# Patient Record
Sex: Female | Born: 1980 | Hispanic: No | Marital: Married | Smoking: Never smoker
Health system: Southern US, Community
[De-identification: ages and names within clinical notes are randomized; demographics above are authoritative.]

## PROBLEM LIST (undated history)

## (undated) ENCOUNTER — Inpatient Hospital Stay (HOSPITAL_COMMUNITY): Payer: Self-pay

## (undated) DIAGNOSIS — O0990 Supervision of high risk pregnancy, unspecified, unspecified trimester: Secondary | ICD-10-CM

## (undated) HISTORY — PX: TONSILLECTOMY: SUR1361

---
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
## 2016-01-07 ENCOUNTER — Other Ambulatory Visit: Payer: Self-pay

## 2016-01-07 DIAGNOSIS — T8332XA Displacement of intrauterine contraceptive device, initial encounter: Secondary | ICD-10-CM

## 2016-01-07 NOTE — Progress Notes (Signed)
Per provider, pt needs US scheduled prior to NEW GYN appt.  US scheduled for November 21st @ 1045.  Front office to contact pt with both appts.

## 2016-01-25 ENCOUNTER — Ambulatory Visit (HOSPITAL_COMMUNITY): Admission: RE | Admit: 2016-01-25 | Payer: Self-pay | Source: Ambulatory Visit

## 2017-05-11 ENCOUNTER — Encounter: Payer: Self-pay | Admitting: Obstetrics & Gynecology

## 2017-05-13 ENCOUNTER — Emergency Department (HOSPITAL_COMMUNITY): Payer: Self-pay

## 2017-05-13 ENCOUNTER — Encounter (HOSPITAL_COMMUNITY): Payer: Self-pay

## 2017-05-13 ENCOUNTER — Other Ambulatory Visit: Payer: Self-pay

## 2017-05-13 ENCOUNTER — Emergency Department (HOSPITAL_COMMUNITY)
Admission: EM | Admit: 2017-05-13 | Discharge: 2017-05-13 | Disposition: A | Discharge status: Home / Self Care | Payer: Self-pay | Attending: Emergency Medicine | Admitting: Emergency Medicine

## 2017-05-13 DIAGNOSIS — R1084 Generalized abdominal pain: Secondary | ICD-10-CM | POA: Insufficient documentation

## 2017-05-13 DIAGNOSIS — R52 Pain, unspecified: Secondary | ICD-10-CM

## 2017-05-13 LAB — URINALYSIS, ROUTINE W REFLEX MICROSCOPIC
BILIRUBIN URINE: NEGATIVE
GLUCOSE, UA: NEGATIVE mg/dL
HGB URINE DIPSTICK: NEGATIVE
Ketones, ur: NEGATIVE mg/dL
NITRITE: NEGATIVE
PROTEIN: NEGATIVE mg/dL
SPECIFIC GRAVITY, URINE: 1.008 (ref 1.005–1.030)
pH: 8 (ref 5.0–8.0)

## 2017-05-13 LAB — COMPREHENSIVE METABOLIC PANEL
ALK PHOS: 72 U/L (ref 38–126)
ALT: 16 U/L (ref 14–54)
ANION GAP: 10 (ref 5–15)
AST: 20 U/L (ref 15–41)
Albumin: 4.2 g/dL (ref 3.5–5.0)
BILIRUBIN TOTAL: 0.3 mg/dL (ref 0.3–1.2)
BUN: 11 mg/dL (ref 6–20)
CALCIUM: 9.5 mg/dL (ref 8.9–10.3)
CO2: 22 mmol/L (ref 22–32)
Chloride: 105 mmol/L (ref 101–111)
Creatinine, Ser: 0.67 mg/dL (ref 0.44–1.00)
Glucose, Bld: 102 mg/dL — ABNORMAL HIGH (ref 65–99)
Potassium: 4.2 mmol/L (ref 3.5–5.1)
Sodium: 137 mmol/L (ref 135–145)
TOTAL PROTEIN: 7.9 g/dL (ref 6.5–8.1)

## 2017-05-13 LAB — CBC
HCT: 34.7 % — ABNORMAL LOW (ref 36.0–46.0)
HEMOGLOBIN: 10.5 g/dL — AB (ref 12.0–15.0)
MCH: 21 pg — ABNORMAL LOW (ref 26.0–34.0)
MCHC: 30.3 g/dL (ref 30.0–36.0)
MCV: 69.5 fL — ABNORMAL LOW (ref 78.0–100.0)
Platelets: 230 10*3/uL (ref 150–400)
RBC: 4.99 MIL/uL (ref 3.87–5.11)
RDW: 18.5 % — ABNORMAL HIGH (ref 11.5–15.5)
WBC: 9 10*3/uL (ref 4.0–10.5)

## 2017-05-13 LAB — I-STAT BETA HCG BLOOD, ED (MC, WL, AP ONLY)

## 2017-05-13 LAB — LIPASE, BLOOD: Lipase: 29 U/L (ref 11–51)

## 2017-05-13 MED ORDER — PANTOPRAZOLE SODIUM 20 MG PO TBEC: 20.0000 mg | DELAYED_RELEASE_TABLET | Freq: Every day | ORAL | 0 refills | Status: DC

## 2017-05-13 MED ORDER — ONDANSETRON 4 MG PO TBDP: 4.0000 mg | ORAL_TABLET | Freq: Three times a day (TID) | ORAL | 0 refills | Status: DC | PRN

## 2017-05-13 MED ORDER — MORPHINE SULFATE (PF) 4 MG/ML IV SOLN
4.0000 mg | Freq: Once | INTRAVENOUS | Status: AC
  Administered 2017-05-13: 4 mg via INTRAVENOUS
  Filled 2017-05-13: qty 1

## 2017-05-13 MED ORDER — ONDANSETRON HCL 4 MG/2ML IJ SOLN
4.0000 mg | Freq: Once | INTRAMUSCULAR | Status: AC
  Administered 2017-05-13: 4 mg via INTRAVENOUS
  Filled 2017-05-13: qty 2

## 2017-05-13 MED ORDER — DICYCLOMINE HCL 20 MG PO TABS: 20.0000 mg | ORAL_TABLET | Freq: Two times a day (BID) | ORAL | 0 refills | Status: DC

## 2017-05-13 MED ORDER — SODIUM CHLORIDE 0.9 % IV BOLUS (SEPSIS)
1000.0000 mL | Freq: Once | INTRAVENOUS | Status: AC
  Administered 2017-05-13: 1000 mL via INTRAVENOUS

## 2017-05-13 NOTE — ED Notes (Signed)
Used [employer] aerobic interpreter for translation

## 2017-05-13 NOTE — ED Provider Notes (Signed)
[facility]-EMERGENCY DEPT Provider Note   CSN: [id number] Arrival date & time: 05/13/17  1191     History   Chief Complaint Chief Complaint  Patient presents with  . Abdominal Pain    HPI Vickie Baldwin is a 37 y.o. female.  Pt presents to the ED today with abdominal pain.  Pt said pain started around 0200.  She points to pain in her epigastrium and in her RUQ.  The pt has a hx of gerd and IBS.  She is unsure if this is similar.  Pt does speak only arabic.  Information obtained via video interpreter.   Pt does not have a doctor here in [place], she is a recent arrival from [place].      History reviewed. No pertinent past medical history.  There are no active problems to display for this patient.   Past Surgical History:  Procedure Laterality Date  . CESAREAN SECTION    . TONSILLECTOMY      OB History    No data available       Home Medications    Prior to Admission medications   Medication Sig Start Date End Date Taking? Authorizing Provider  Collagen 500 MG CAPS Take 1 capsule by mouth daily.   Yes [provider]  PRESCRIPTION MEDICATION Prescription for IBS purchased in [place]   Yes [provider]  dicyclomine (BENTYL) 20 MG tablet Take 1 tablet (20 mg total) by mouth 2 (two) times daily. 05/13/17   Jacalyn Lefevre, MD  ondansetron (ZOFRAN ODT) 4 MG disintegrating tablet Take 1 tablet (4 mg total) by mouth every 8 (eight) hours as needed. 05/13/17   Jacalyn Lefevre, MD  pantoprazole (PROTONIX) 20 MG tablet Take 1 tablet (20 mg total) by mouth daily. 05/13/17   Jacalyn Lefevre, MD    Family History History reviewed. No pertinent family history.  Social History Social History   Tobacco Use  . Smoking status: Not on file  . Smokeless tobacco: Never Used  Substance Use Topics  . Alcohol use: No    Frequency: Never  . Drug use: No     Allergies   Patient has no known allergies.   Review of Systems Review of  Systems  Gastrointestinal: Positive for abdominal pain and nausea.  All other systems reviewed and are negative.    Physical Exam Updated Vital Signs BP (!) 123/57   Pulse 67   Temp 98 F (36.7 C) (Oral)   Resp 16   Ht 5\' 4"  (1.626 m)   Wt 88 kg (194 lb 0.1 oz)   SpO2 100%   BMI 33.30 kg/m   Physical Exam  Constitutional: She is oriented to person, place, and time. She appears well-developed and well-nourished.  HENT:  Head: Normocephalic and atraumatic.  Mouth/Throat: Mucous membranes are dry.  Eyes: EOM are normal. Pupils are equal, round, and reactive to light.  Cardiovascular: Normal rate, regular rhythm, normal heart sounds and intact distal pulses.  Pulmonary/Chest: Effort normal and breath sounds normal.  Abdominal: Normal appearance and bowel sounds are normal. There is tenderness in the right upper quadrant and epigastric area.  Neurological: She is alert and oriented to person, place, and time.  Skin: Skin is warm. Capillary refill takes less than 2 seconds.  Psychiatric: She has a normal mood and affect. Her behavior is normal.  Nursing note and vitals reviewed.    ED Treatments / Results  Labs (all labs ordered are listed, but only abnormal results are displayed)  Labs Reviewed  COMPREHENSIVE METABOLIC PANEL - Abnormal; Notable for the following components:      Result Value   Glucose, Bld 102 (*)    All other components within normal limits  CBC - Abnormal; Notable for the following components:   Hemoglobin 10.5 (*)    HCT 34.7 (*)    MCV 69.5 (*)    MCH 21.0 (*)    RDW 18.5 (*)    All other components within normal limits  URINALYSIS, ROUTINE W REFLEX MICROSCOPIC - Abnormal; Notable for the following components:   Leukocytes, UA TRACE (*)    Bacteria, UA FEW (*)    Squamous Epithelial / LPF 6-30 (*)    All other components within normal limits  LIPASE, BLOOD  I-STAT BETA HCG BLOOD, ED ([facility], [facility], [facility] ONLY)    EKG  EKG Interpretation None        Radiology [place] Abdomen Limited Ruq  Result Date: 05/13/2017 CLINICAL DATA:  Right upper quadrant pain and nausea since 2 a.m. EXAM: ULTRASOUND ABDOMEN LIMITED RIGHT UPPER QUADRANT COMPARISON:  None. FINDINGS: Gallbladder: No gallstones or wall thickening visualized. No sonographic Murphy sign noted by sonographer. Common bile duct: Diameter: 3 mm, normal. Liver: No focal lesion identified. Mildly increased in parenchymal echogenicity. Portal vein is patent on color Doppler imaging with normal direction of blood flow towards the liver. IMPRESSION: 1. No acute abnormality. 2. Mildly increased hepatic parenchymal echogenicity, as can be seen with steatosis. Electronically Signed   By: Obie DredgeWilliam T Derry M.D.   On: 05/13/2017 10:08    Procedures Procedures (including critical care time)  Medications Ordered in ED Medications  sodium chloride 0.9 % bolus 1,000 mL (0 mLs Intravenous Stopped 05/13/17 0959)  ondansetron (ZOFRAN) injection 4 mg (4 mg Intravenous Given 05/13/17 0820)  morphine 4 MG/ML injection 4 mg (4 mg Intravenous Given 05/13/17 0820)     Initial Impression / Assessment and Plan / ED Course  I have reviewed the triage vital signs and the nursing notes.  Pertinent labs & imaging results that were available during my care of the patient were reviewed by me and considered in my medical decision making (see chart for details).    Pt is feeling much better.  She is encouraged to make a log of the food she eats to see if something is irritating her stomach.  Family requests number to [place] as she lives near there.  She is given the number of GI and instructed to f/u and to return if worse.  Final Clinical Impressions(s) / ED Diagnoses   Final diagnoses:  Pain  Generalized abdominal pain    ED Discharge Orders        Ordered    pantoprazole (PROTONIX) 20 MG tablet  Daily     05/13/17 1034    ondansetron (ZOFRAN ODT) 4 MG disintegrating tablet  Every 8 hours PRN     05/13/17  1034    dicyclomine (BENTYL) 20 MG tablet  2 times daily     05/13/17 1034       Jacalyn LefevreHaviland, Jeb Schloemer, MD 05/13/17 1037

## 2017-05-13 NOTE — ED Triage Notes (Signed)
Since yesterday abdominal pain with nausea no dysuria voiced states not pregnant no fever.

## 2017-05-13 NOTE — ED Notes (Signed)
Bed: [id number] Expected date:  Expected time:  Means of arrival:  Comments: Triage 22

## 2017-05-13 NOTE — ED Notes (Signed)
Upon speaking with patient, patient has been diagnosed with IBS and GERD. Began having sharp pains at 0200 this morning in her umbilicus. Patient also states she has stomach and esophageal burning. Patient unsure if which one is causing her symptoms.

## 2017-06-06 ENCOUNTER — Encounter: Payer: Self-pay | Admitting: *Deleted

## 2017-06-15 ENCOUNTER — Ambulatory Visit: Payer: Self-pay | Admitting: Obstetrics & Gynecology

## 2017-07-23 ENCOUNTER — Encounter: Payer: Self-pay | Admitting: Obstetrics & Gynecology

## 2017-07-23 ENCOUNTER — Ambulatory Visit (INDEPENDENT_AMBULATORY_CARE_PROVIDER_SITE_OTHER): Payer: BLUE CROSS/BLUE SHIELD | Admitting: Obstetrics & Gynecology

## 2017-07-23 VITALS — BP 109/69 | HR 77 | Resp 16 | Ht 62.99 in | Wt 194.0 lb

## 2017-07-23 DIAGNOSIS — T8332XA Displacement of intrauterine contraceptive device, initial encounter: Secondary | ICD-10-CM

## 2017-07-23 DIAGNOSIS — Z30432 Encounter for removal of intrauterine contraceptive device: Secondary | ICD-10-CM | POA: Diagnosis not present

## 2017-07-23 NOTE — Progress Notes (Signed)
   Subjective:    Patient ID: Vickie Baldwin, female    DOB: 15-Apr-1980, 37 y.o.   MRN: [id number]  HPI 37 yo married P2 here to have her 37 yo Paragard removed. They have attempted to remove it 3 times at the health dept without success. She would like another pregnancy, hoping for a daughter.  Review of Systems She reports a normal pap smear at the health dept recently.    Objective:   Physical Exam Breathing, conversing, and ambulating normally Live interpretor used for this visit. Her strings were not visible. Her cervix was prepped with betadine and grasped with a single tooth tenaculum. I used a IUD hook to pull the intact Paragard out without difficulty. She tolerated the procedure well.     Assessment & Plan:  Desire for pregnancy Rec start MVIs daily to help prevent ONTDs

## 2017-08-08 ENCOUNTER — Encounter: Payer: Self-pay | Admitting: *Deleted

## 2018-02-05 ENCOUNTER — Ambulatory Visit: Payer: BLUE CROSS/BLUE SHIELD | Admitting: Family Medicine

## 2018-02-05 ENCOUNTER — Encounter: Payer: Self-pay | Admitting: Family Medicine

## 2018-02-05 ENCOUNTER — Other Ambulatory Visit: Payer: Self-pay

## 2018-02-05 VITALS — BP 111/73 | HR 102 | Temp 99.0°F | Resp 18 | Ht 62.99 in | Wt 197.2 lb

## 2018-02-05 DIAGNOSIS — R6889 Other general symptoms and signs: Secondary | ICD-10-CM

## 2018-02-05 DIAGNOSIS — Z23 Encounter for immunization: Secondary | ICD-10-CM

## 2018-02-05 DIAGNOSIS — Z3200 Encounter for pregnancy test, result unknown: Secondary | ICD-10-CM

## 2018-02-05 DIAGNOSIS — Z3491 Encounter for supervision of normal pregnancy, unspecified, first trimester: Secondary | ICD-10-CM

## 2018-02-05 LAB — POCT URINE PREGNANCY: PREG TEST UR: POSITIVE — AB

## 2018-02-05 NOTE — Progress Notes (Signed)
Patient ID: Vickie Baldwin, female    DOB: [birth year]  Age: 37 y.o. MRN: [id number]  Chief Complaint  Patient presents with  . Amenorrhea    missed period and took a home test and was positive     Subjective:   Has had a bad cough for the past couple days and some fever.  Her menstrual cycle is late, with LMP October 22.  She took a home pregnancy test and was positive.  Her children are in their 3 teens or teens, 2 children.  She is excited about being pregnant and says her husband is excited about it.  She is from [place], and has had a previous medical care in [place].  She has an appointment with an OB/GYN but it was not for a while longer so she came in here today.  Has not had a flu shot this year.  She has started taking some prenatal vitamins.  Also some prenatal supplements.  I told him the vitamins were the important thing.  Current allergies, medications, problem list, past/family and social histories reviewed.  Objective:  BP 111/73   Pulse (!) 102   Temp 99 F (37.2 C) (Oral)   Resp 18   Ht 5' 2.99" (1.6 m)   Wt 197 lb 3.2 oz (89.4 kg)   LMP 12/25/2017   SpO2 99%   BMI 34.94 kg/m   Does not look bad but she keeps coughing a dry cough.  Her TMs are normal.  Throat clear.  Neck supple without nodes.  Chest clear.  Heart regular without murmurs.  Assessment & Plan:   Assessment: 1. First trimester pregnancy   2. Possible pregnancy   3. Need for influenza vaccination   4. Flu-like symptoms       Plan: Explained to her that we cannot manage pregnancy problems.  However did tell her the prenatal vitamin was the right thing to be on.  See instructions.  Orders Placed This Encounter  Procedures  . Flu Vaccine QUAD 36+ mos IM  . POCT urine pregnancy    No orders of the defined types were placed in this encounter.        Patient Instructions    CONGRATULATIONS ON YOUR PREGNANCY!  Your due date should be late July, about July 29th or so.    Take prenatal  vitamins daily  Get your flu shot in a few days when feeling a little better.  You can come here or go to a pharmacy to get that.  If complications of pregnancy call the ob-gyn you have your appointment with and tell them you need to be seen sooner.  Take a DM containing syrup, such as Mucinex DM or Robitussin DM.    Tylenol 500 mg maximum of 2 pills 3 times daily  Return if worse.        If you have lab work done today you will be contacted with your lab results within the next 2 weeks.  If you have not heard from [place] then please contact us. The fastest way to get your results is to register for My Chart.   IF you received an x-ray today, you will receive an invoice from [facility]. Please contact [facility] at [phone] with questions or concerns regarding your invoice.   IF you received labwork today, you will receive an invoice from [facility]. Please contact [facility] at [phone] with questions or concerns regarding your invoice.   Our billing staff will not be able to assist  you with questions regarding bills from these companies.  You will be contacted with the lab results as soon as they are available. The fastest way to get your results is to activate your My Chart account. Instructions are located on the last page of this paperwork. If you have not heard from us regarding the results in 2 weeks, please contact this office.        Return if symptoms worsen or fail to improve.   Janace Hoardavid Hopper, MD 02/05/2018

## 2018-02-05 NOTE — Patient Instructions (Addendum)
  CONGRATULATIONS ON YOUR PREGNANCY!  Your due date should be late July, about July 29th or so.    Take prenatal vitamins daily  Get your flu shot in a few days when feeling a little better.  You can come here or go to a pharmacy to get that.  If complications of pregnancy call the ob-gyn you have your appointment with and tell them you need to be seen sooner.  Take a DM containing syrup, such as Mucinex DM or Robitussin DM.    Tylenol 500 mg maximum of 2 pills 3 times daily  Return if worse.        If you have lab work done today you will be contacted with your lab results within the next 2 weeks.  If you have not heard from us then please contact us. The fastest way to get your results is to register for My Chart.   IF you received an x-ray today, you will receive an invoice from [facility]. Please contact [facility] at [phone] with questions or concerns regarding your invoice.   IF you received labwork today, you will receive an invoice from [facility]. Please contact [facility] at [phone] with questions or concerns regarding your invoice.   Our billing staff will not be able to assist you with questions regarding bills from these companies.  You will be contacted with the lab results as soon as they are available. The fastest way to get your results is to activate your My Chart account. Instructions are located on the last page of this paperwork. If you have not heard from us regarding the results in 2 weeks, please contact this office.

## 2018-02-08 ENCOUNTER — Other Ambulatory Visit: Payer: Self-pay

## 2018-02-08 ENCOUNTER — Ambulatory Visit (INDEPENDENT_AMBULATORY_CARE_PROVIDER_SITE_OTHER): Payer: BLUE CROSS/BLUE SHIELD

## 2018-02-08 VITALS — BP 122/67 | HR 96 | Ht 62.0 in | Wt 197.0 lb

## 2018-02-08 DIAGNOSIS — Z3201 Encounter for pregnancy test, result positive: Secondary | ICD-10-CM | POA: Diagnosis not present

## 2018-02-08 DIAGNOSIS — O0990 Supervision of high risk pregnancy, unspecified, unspecified trimester: Secondary | ICD-10-CM

## 2018-02-08 DIAGNOSIS — Z3480 Encounter for supervision of other normal pregnancy, unspecified trimester: Secondary | ICD-10-CM

## 2018-02-08 HISTORY — DX: Supervision of high risk pregnancy, unspecified, unspecified trimester: O09.90

## 2018-02-08 LAB — POCT URINE PREGNANCY: PREG TEST UR: POSITIVE — AB

## 2018-02-08 NOTE — Progress Notes (Signed)
Vickie Baldwin presents today for UPT. She has no unusual complaints. LMP: 12/25/17 EDD 09/30/17    OBJECTIVE: Appears well, in no apparent distress.  OB History    Gravida  3   Para  2   Term  2   Preterm      AB      Living  2     SAB      TAB      Ectopic      Multiple      Live Births             Home UPT Result: POSITIVE In-Office UPT result: POSITIVE I have reviewed the patient's medical, obstetrical, social, and family histories, and medications.   ASSESSMENT: Positive pregnancy test  PLAN Prenatal care to be completed at: [facility]

## 2018-03-11 ENCOUNTER — Other Ambulatory Visit (HOSPITAL_COMMUNITY)
Admission: RE | Admit: 2018-03-11 | Discharge: 2018-03-11 | Disposition: A | Discharge status: Home / Self Care | Payer: BLUE CROSS/BLUE SHIELD | Source: Ambulatory Visit | Attending: Obstetrics and Gynecology | Admitting: Obstetrics and Gynecology

## 2018-03-11 ENCOUNTER — Ambulatory Visit (INDEPENDENT_AMBULATORY_CARE_PROVIDER_SITE_OTHER): Payer: BLUE CROSS/BLUE SHIELD | Admitting: Obstetrics and Gynecology

## 2018-03-11 ENCOUNTER — Encounter: Payer: Self-pay | Admitting: Obstetrics and Gynecology

## 2018-03-11 DIAGNOSIS — Z23 Encounter for immunization: Secondary | ICD-10-CM | POA: Diagnosis not present

## 2018-03-11 DIAGNOSIS — Z3480 Encounter for supervision of other normal pregnancy, unspecified trimester: Secondary | ICD-10-CM | POA: Insufficient documentation

## 2018-03-11 DIAGNOSIS — Z3481 Encounter for supervision of other normal pregnancy, first trimester: Secondary | ICD-10-CM

## 2018-03-11 DIAGNOSIS — Z98891 History of uterine scar from previous surgery: Secondary | ICD-10-CM | POA: Insufficient documentation

## 2018-03-11 DIAGNOSIS — O34219 Maternal care for unspecified type scar from previous cesarean delivery: Secondary | ICD-10-CM

## 2018-03-11 DIAGNOSIS — O09529 Supervision of elderly multigravida, unspecified trimester: Secondary | ICD-10-CM | POA: Insufficient documentation

## 2018-03-11 DIAGNOSIS — O09521 Supervision of elderly multigravida, first trimester: Secondary | ICD-10-CM

## 2018-03-11 DIAGNOSIS — O99211 Obesity complicating pregnancy, first trimester: Secondary | ICD-10-CM

## 2018-03-11 DIAGNOSIS — O99210 Obesity complicating pregnancy, unspecified trimester: Secondary | ICD-10-CM | POA: Insufficient documentation

## 2018-03-11 MED ORDER — PROMETHAZINE HCL 25 MG PO TABS: 25.0000 mg | ORAL_TABLET | Freq: Four times a day (QID) | ORAL | 1 refills | Status: DC | PRN

## 2018-03-11 MED ORDER — VITAFOL ULTRA 29-0.6-0.4-200 MG PO CAPS: 1.0000 | ORAL_CAPSULE | Freq: Every day | ORAL | 12 refills | Status: DC

## 2018-03-11 NOTE — Progress Notes (Signed)
Patient is in the office for NOB, denies pain. 

## 2018-03-11 NOTE — Patient Instructions (Signed)
Contraception Choices Contraception, also called birth control, refers to methods or devices that prevent pregnancy. Hormonal methods Contraceptive implant  A contraceptive implant is a thin, plastic tube that contains a hormone. It is inserted into the upper part of the arm. It can remain in place for up to 3 years. Progestin-only injections Progestin-only injections are injections of progestin, a synthetic form of the hormone progesterone. They are given every 3 months by a health care provider. Birth control pills  Birth control pills are pills that contain hormones that prevent pregnancy. They must be taken once a day, preferably at the same time each day. Birth control patch  The birth control patch contains hormones that prevent pregnancy. It is placed on the skin and must be changed once a week for three weeks and removed on the fourth week. A prescription is needed to use this method of contraception. Vaginal ring  A vaginal ring contains hormones that prevent pregnancy. It is placed in the vagina for three weeks and removed on the fourth week. After that, the process is repeated with a new ring. A prescription is needed to use this method of contraception. Emergency contraceptive Emergency contraceptives prevent pregnancy after unprotected sex. They come in pill form and can be taken up to 5 days after sex. They work best the sooner they are taken after having sex. Most emergency contraceptives are available without a prescription. This method should not be used as your only form of birth control. Barrier methods Female condom  A female condom is a thin sheath that is worn over the penis during sex. Condoms keep sperm from going inside a woman's body. They can be used with a spermicide to increase their effectiveness. They should be disposed after a single use. Female condom  A female condom is a soft, loose-fitting sheath that is put into the vagina before sex. The condom keeps  sperm from going inside a woman's body. They should be disposed after a single use. Diaphragm  A diaphragm is a soft, dome-shaped barrier. It is inserted into the vagina before sex, along with a spermicide. The diaphragm blocks sperm from entering the uterus, and the spermicide kills sperm. A diaphragm should be left in the vagina for 6-8 hours after sex and removed within 24 hours. A diaphragm is prescribed and fitted by a health care provider. A diaphragm should be replaced every 1-2 years, after giving birth, after gaining more than 15 lb (6.8 kg), and after pelvic surgery. Cervical cap  A cervical cap is a round, soft latex or plastic cup that fits over the cervix. It is inserted into the vagina before sex, along with spermicide. It blocks sperm from entering the uterus. The cap should be left in place for 6-8 hours after sex and removed within 48 hours. A cervical cap must be prescribed and fitted by a health care provider. It should be replaced every 2 years. Sponge  A sponge is a soft, circular piece of polyurethane foam with spermicide on it. The sponge helps block sperm from entering the uterus, and the spermicide kills sperm. To use it, you make it wet and then insert it into the vagina. It should be inserted before sex, left in for at least 6 hours after sex, and removed and thrown away within 30 hours. Spermicides Spermicides are chemicals that kill or block sperm from entering the cervix and uterus. They can come as a cream, jelly, suppository, foam, or tablet. A spermicide should be inserted into  the vagina with an applicator at least 10-15 minutes before sex to allow time for it to work. The process must be repeated every time you have sex. Spermicides do not require a prescription. Intrauterine contraception Intrauterine device (IUD) An IUD is a T-shaped device that is put in a woman's uterus. There are two types:  Hormone IUD.This type contains progestin, a synthetic form of the  hormone progesterone. This type can stay in place for 3-5 years.  Copper IUD.This type is wrapped in copper wire. It can stay in place for 10 years.  Permanent methods of contraception Female tubal ligation In this method, a woman's fallopian tubes are sealed, tied, or blocked during surgery to prevent eggs from traveling to the uterus. Hysteroscopic sterilization In this method, a small, flexible insert is placed into each fallopian tube. The inserts cause scar tissue to form in the fallopian tubes and block them, so sperm cannot reach an egg. The procedure takes about 3 months to be effective. Another form of birth control must be used during those 3 months. Female sterilization This is a procedure to tie off the tubes that carry sperm (vasectomy). After the procedure, the man can still ejaculate fluid (semen). Natural planning methods Natural family planning In this method, a couple does not have sex on days when the woman could become pregnant. Calendar method This means keeping track of the length of each menstrual cycle, identifying the days when pregnancy can happen, and not having sex on those days. Ovulation method In this method, a couple avoids sex during ovulation. Symptothermal method This method involves not having sex during ovulation. The woman typically checks for ovulation by watching changes in her temperature and in the consistency of cervical mucus. Post-ovulation method In this method, a couple waits to have sex until after ovulation. Summary  Contraception, also called birth control, means methods or devices that prevent pregnancy.  Hormonal methods of contraception include implants, injections, pills, patches, vaginal rings, and emergency contraceptives.  Barrier methods of contraception can include female condoms, female condoms, diaphragms, cervical caps, sponges, and spermicides.  There are two types of IUDs (intrauterine devices). An IUD can be put in a woman's  uterus to prevent pregnancy for 3-5 years.  Permanent sterilization can be done through a procedure for males, females, or both.  Natural family planning methods involve not having sex on days when the woman could become pregnant. This information is not intended to replace advice given to you by your health care provider. Make sure you discuss any questions you have with your health care provider. Document Released: 02/20/2005 Document Revised: 02/22/2017 Document Reviewed: 03/25/2016 Elsevier Interactive Patient Education  2019 [employer].   Breastfeeding  Choosing to breastfeed is one of the best decisions you can make for yourself and your baby. A change in hormones during pregnancy causes your breasts to make breast milk in your milk-producing glands. Hormones prevent breast milk from being released before your baby is born. They also prompt milk flow after birth. Once breastfeeding has begun, thoughts of your baby, as well as his or her sucking or crying, can stimulate the release of milk from your milk-producing glands. Benefits of breastfeeding Research shows that breastfeeding offers many health benefits for infants and mothers. It also offers a cost-free and convenient way to feed your baby. For your baby  Your first milk (colostrum) helps your baby's digestive system to function better.  Special cells in your milk (antibodies) help your baby to fight off infections.  Breastfed babies are less likely to develop asthma, allergies, obesity, or type 2 diabetes. They are also at lower risk for sudden infant death syndrome (SIDS).  Nutrients in breast milk are better able to meet your baby's needs compared to infant formula.  Breast milk improves your baby's brain development. For you  Breastfeeding helps to create a very special bond between you and your baby.  Breastfeeding is convenient. Breast milk costs nothing and is always available at the correct  temperature.  Breastfeeding helps to burn calories. It helps you to lose the weight that you gained during pregnancy.  Breastfeeding makes your uterus return faster to its size before pregnancy. It also slows bleeding (lochia) after you give birth.  Breastfeeding helps to lower your risk of developing type 2 diabetes, osteoporosis, rheumatoid arthritis, cardiovascular disease, and breast, ovarian, uterine, and endometrial cancer later in life. Breastfeeding basics Starting breastfeeding  Find a comfortable place to sit or lie down, with your neck and back well-supported.  Place a pillow or a rolled-up blanket under your baby to bring him or her to the level of your breast (if you are seated). Nursing pillows are specially designed to help support your arms and your baby while you breastfeed.  Make sure that your baby's tummy (abdomen) is facing your abdomen.  Gently massage your breast. With your fingertips, massage from the outer edges of your breast inward toward the nipple. This encourages milk flow. If your milk flows slowly, you may need to continue this action during the feeding.  Support your breast with 4 fingers underneath and your thumb above your nipple (make the letter "C" with your hand). Make sure your fingers are well away from your nipple and your baby's mouth.  Stroke your baby's lips gently with your finger or nipple.  When your baby's mouth is open wide enough, quickly bring your baby to your breast, placing your entire nipple and as much of the areola as possible into your baby's mouth. The areola is the colored area around your nipple. ? More areola should be visible above your baby's upper lip than below the lower lip. ? Your baby's lips should be opened and extended outward (flanged) to ensure an adequate, comfortable latch. ? Your baby's tongue should be between his or her lower gum and your breast.  Make sure that your baby's mouth is correctly positioned around  your nipple (latched). Your baby's lips should create a seal on your breast and be turned out (everted).  It is common for your baby to suck about 2-3 minutes in order to start the flow of breast milk. Latching Teaching your baby how to latch onto your breast properly is very important. An improper latch can cause nipple pain, decreased milk supply, and poor weight gain in your baby. Also, if your baby is not latched onto your nipple properly, he or she may swallow some air during feeding. This can make your baby fussy. Burping your baby when you switch breasts during the feeding can help to get rid of the air. However, teaching your baby to latch on properly is still the best way to prevent fussiness from swallowing air while breastfeeding. Signs that your baby has successfully latched onto your nipple  Silent tugging or silent sucking, without causing you pain. Infant's lips should be extended outward (flanged).  Swallowing heard between every 3-4 sucks once your milk has started to flow (after your let-down milk reflex occurs).  Muscle movement above and in front of  his or her ears while sucking. Signs that your baby has not successfully latched onto your nipple  Sucking sounds or smacking sounds from your baby while breastfeeding.  Nipple pain. If you think your baby has not latched on correctly, slip your finger into the corner of your baby's mouth to break the suction and place it between your baby's gums. Attempt to start breastfeeding again. Signs of successful breastfeeding Signs from your baby  Your baby will gradually decrease the number of sucks or will completely stop sucking.  Your baby will fall asleep.  Your baby's body will relax.  Your baby will retain a small amount of milk in his or her mouth.  Your baby will let go of your breast by himself or herself. Signs from you  Breasts that have increased in firmness, weight, and size 1-3 hours after feeding.  Breasts  that are softer immediately after breastfeeding.  Increased milk volume, as well as a change in milk consistency and color by the fifth day of breastfeeding.  Nipples that are not sore, cracked, or bleeding. Signs that your baby is getting enough milk  Wetting at least 1-2 diapers during the first 24 hours after birth.  Wetting at least 5-6 diapers every 24 hours for the first week after birth. The urine should be clear or pale yellow by the age of 5 days.  Wetting 6-8 diapers every 24 hours as your baby continues to grow and develop.  At least 3 stools in a 24-hour period by the age of 5 days. The stool should be soft and yellow.  At least 3 stools in a 24-hour period by the age of 7 days. The stool should be seedy and yellow.  No loss of weight greater than 10% of birth weight during the first 3 days of life.  Average weight gain of 4-7 oz (113-198 g) per week after the age of 4 days.  Consistent daily weight gain by the age of 5 days, without weight loss after the age of 2 weeks. After a feeding, your baby may spit up a small amount of milk. This is normal. Breastfeeding frequency and duration Frequent feeding will help you make more milk and can prevent sore nipples and extremely full breasts (breast engorgement). Breastfeed when you feel the need to reduce the fullness of your breasts or when your baby shows signs of hunger. This is called "breastfeeding on demand." Signs that your baby is hungry include:  Increased alertness, activity, or restlessness.  Movement of the head from side to side.  Opening of the mouth when the corner of the mouth or cheek is stroked (rooting).  Increased sucking sounds, smacking lips, cooing, sighing, or squeaking.  Hand-to-mouth movements and sucking on fingers or hands.  Fussing or crying. Avoid introducing a pacifier to your baby in the first 4-6 weeks after your baby is born. After this time, you may choose to use a pacifier. Research has  shown that pacifier use during the first year of a baby's life decreases the risk of sudden infant death syndrome (SIDS). Allow your baby to feed on each breast as long as he or she wants. When your baby unlatches or falls asleep while feeding from the first breast, offer the second breast. Because newborns are often sleepy in the first few weeks of life, you may need to awaken your baby to get him or her to feed. Breastfeeding times will vary from baby to baby. However, the following rules can serve as  a guide to help you make sure that your baby is properly fed:  Newborns (babies 85 weeks of age or younger) may breastfeed every 1-3 hours.  Newborns should not go without breastfeeding for longer than 3 hours during the day or 5 hours during the night.  You should breastfeed your baby a minimum of 8 times in a 24-hour period. Breast milk pumping     Pumping and storing breast milk allows you to make sure that your baby is exclusively fed your breast milk, even at times when you are unable to breastfeed. This is especially important if you go back to work while you are still breastfeeding, or if you are not able to be present during feedings. Your lactation consultant can help you find a method of pumping that works best for you and give you guidelines about how long it is safe to store breast milk. Caring for your breasts while you breastfeed Nipples can become dry, cracked, and sore while breastfeeding. The following recommendations can help keep your breasts moisturized and healthy:  Avoid using soap on your nipples.  Wear a supportive bra designed especially for nursing. Avoid wearing underwire-style bras or extremely tight bras (sports bras).  Air-dry your nipples for 3-4 minutes after each feeding.  Use only cotton bra pads to absorb leaked breast milk. Leaking of breast milk between feedings is normal.  Use lanolin on your nipples after breastfeeding. Lanolin helps to maintain your  skin's normal moisture barrier. Pure lanolin is not harmful (not toxic) to your baby. You may also hand express a few drops of breast milk and gently massage that milk into your nipples and allow the milk to air-dry. In the first few weeks after giving birth, some women experience breast engorgement. Engorgement can make your breasts feel heavy, warm, and tender to the touch. Engorgement peaks within 3-5 days after you give birth. The following recommendations can help to ease engorgement:  Completely empty your breasts while breastfeeding or pumping. You may want to start by applying warm, moist heat (in the shower or with warm, water-soaked hand towels) just before feeding or pumping. This increases circulation and helps the milk flow. If your baby does not completely empty your breasts while breastfeeding, pump any extra milk after he or she is finished.  Apply ice packs to your breasts immediately after breastfeeding or pumping, unless this is too uncomfortable for you. To do this: ? Put ice in a plastic bag. ? Place a towel between your skin and the bag. ? Leave the ice on for 20 minutes, 2-3 times a day.  Make sure that your baby is latched on and positioned properly while breastfeeding. If engorgement persists after 48 hours of following these recommendations, contact your health care provider or a Advertising copywriter. Overall health care recommendations while breastfeeding  Eat 3 healthy meals and 3 snacks every day. Well-nourished mothers who are breastfeeding need an additional 450-500 calories a day. You can meet this requirement by increasing the amount of a balanced diet that you eat.  Drink enough water to keep your urine pale yellow or clear.  Rest often, relax, and continue to take your prenatal vitamins to prevent fatigue, stress, and low vitamin and mineral levels in your body (nutrient deficiencies).  Do not use any products that contain nicotine or tobacco, such as cigarettes  and e-cigarettes. Your baby may be harmed by chemicals from cigarettes that pass into breast milk and exposure to secondhand smoke. If you need help  quitting, ask your health care provider.  Avoid alcohol.  Do not use illegal drugs or marijuana.  Talk with your health care provider before taking any medicines. These include over-the-counter and prescription medicines as well as vitamins and herbal supplements. Some medicines that may be harmful to your baby can pass through breast milk.  It is possible to become pregnant while breastfeeding. If birth control is desired, ask your health care provider about options that will be safe while breastfeeding your baby. Where to find more information: [employer]: www.llli.org Contact a health care provider if:  You feel like you want to stop breastfeeding or have become frustrated with breastfeeding.  Your nipples are cracked or bleeding.  Your breasts are red, tender, or warm.  You have: ? Painful breasts or nipples. ? A swollen area on either breast. ? A fever or chills. ? Nausea or vomiting. ? Drainage other than breast milk from your nipples.  Your breasts do not become full before feedings by the fifth day after you give birth.  You feel sad and depressed.  Your baby is: ? Too sleepy to eat well. ? Having trouble sleeping. ? More than 931 week old and wetting fewer than 6 diapers in a 24-hour period. ? Not gaining weight by 365 days of age.  Your baby has fewer than 3 stools in a 24-hour period.  Your baby's skin or the white parts of his or her eyes become yellow. Get help right away if:  Your baby is overly tired (lethargic) and does not want to wake up and feed.  Your baby develops an unexplained fever. Summary  Breastfeeding offers many health benefits for infant and mothers.  Try to breastfeed your infant when he or she shows early signs of hunger.  Gently tickle or stroke your baby's lips with your  finger or nipple to allow the baby to open his or her mouth. Bring the baby to your breast. Make sure that much of the areola is in your baby's mouth. Offer one side and burp the baby before you offer the other side.  Talk with your health care provider or lactation consultant if you have questions or you face problems as you breastfeed. This information is not intended to replace advice given to you by your health care provider. Make sure you discuss any questions you have with your health care provider. Document Released: 02/20/2005 Document Revised: 03/24/2016 Document Reviewed: 03/24/2016 Elsevier Interactive Patient Education  2019 [employer]   Vaginal Birth After Cesarean Delivery  Vaginal birth after cesarean delivery (VBAC) is giving birth vaginally after previously delivering a baby through a cesarean section (C-section). A VBAC may be a safe option for you, depending on your health and other factors. It is important to discuss VBAC with your health care provider early in your pregnancy so you can understand the risks, benefits, and options. Having these discussions early will give you time to make your birth plan. Who are the best candidates for VBAC? The best candidates for VBAC are women who:  Have had one or two prior cesarean deliveries, and the incision made during the delivery was horizontal (low transverse).  Do not have a vertical (classical) scar on their uterus.  Have not had a tear in the wall of their uterus (uterine rupture).  Plan to have more pregnancies. A VBAC is also more likely to be successful:  In women who have previously given birth vaginally.  When labor starts by itself (spontaneously)  before the due date. What are the benefits of VBAC? The benefits of delivering your baby vaginally instead of by a cesarean delivery include:  A shorter hospital stay.  A faster recovery time.  Less pain.  Avoiding risks associated with major surgery, such as  infection and blood clots.  Less blood loss and less need for donated blood (transfusions). What are the risks of VBAC? The main risk of attempting a VBAC is that it may fail, forcing your health care provider to deliver your baby by a C-section. Other risks are rare and include:  Tearing (rupture) of the scar from a past cesarean delivery.  Other risks associated with vaginal deliveries. If a repeat cesarean delivery is needed, the risks include:  Blood loss.  Infection.  Blood clot.  Damage to surrounding organs.  Removal of the uterus (hysterectomy), if it is damaged.  Placenta problems in future pregnancies. What else should I know about my options? Delivering a baby through a VBAC is similar to having a normal spontaneous vaginal delivery. Therefore, it is safe:  To try with twins.  For your health care provider to try to turn the baby from a breech position (external cephalic version) during labor.  With epidural analgesia for pain relief. Consider where you would like to deliver your baby. VBAC should be attempted in facilities where an emergency cesarean delivery can be performed. VBAC is not recommended for home births. Any changes in your health or your baby's health during your pregnancy may make it necessary to change your initial decision about VBAC. Your health care provider may recommend that you do not attempt a VBAC if:  Your baby's suspected weight is 8.8 lb (4 kg) or more.  You have preeclampsia. This is a condition that causes high blood pressure along with other symptoms, such as swelling and headaches.  You will have VBAC less than 19 months after your cesarean delivery.  You are past your due date.  You need to have labor started (induced) because your cervix is not ready for labor (unfavorable). Where to find more information  American Pregnancy Association: americanpregnancy.org  [employer]:  acog.org Summary  Vaginal birth after cesarean delivery (VBAC) is giving birth vaginally after previously delivering a baby through a cesarean section (C-section). A VBAC may be a safe option for you, depending on your health and other factors.  Discuss VBAC with your health care provider early in your pregnancy so you can understand the risks, benefits, options, and have plenty of time to make your birth plan.  The main risk of attempting a VBAC is that it may fail, forcing your health care provider to deliver your baby by a C-section. Other risks are rare. This information is not intended to replace advice given to you by your health care provider. Make sure you discuss any questions you have with your health care provider. Document Released: 08/13/2006 Document Revised: 05/30/2016 Document Reviewed: 05/30/2016 Elsevier Interactive Patient Education  2019 [employer].

## 2018-03-11 NOTE — Progress Notes (Signed)
  Subjective:    Vickie Baldwin is a [id number] 3776w6d being seen today for her first obstetrical visit.  Her obstetrical history is significant for advanced maternal age and previous cesarean section due to failure to progress and maternal obesity. Patient does intend to breast feed. Pregnancy history fully reviewed.  Patient reports nausea.  Vitals:   03/11/18 1052  BP: 108/70  Pulse: 98  Weight: 196 lb (88.9 kg)    HISTORY: OB History  Gravida Para Term Preterm AB Living  3 2 2     2   SAB TAB Ectopic Multiple Live Births          2    # Outcome Date GA Lbr Len/2nd Weight Sex Delivery Anes PTL Lv  3 Current           2 Term 09/30/06     CS-LTranv   LIV  1 Term 10/18/02     Vag-Spont   LIV   History reviewed. No pertinent past medical history. Past Surgical History:  Procedure Laterality Date  . CESAREAN SECTION    . TONSILLECTOMY     Family History  Problem Relation Age of Onset  . Diabetes Mother   . Hypertension Father   . Diabetes Brother      Exam    Uterus:     Pelvic Exam:    Perineum: No Hemorrhoids, Normal Perineum   Vulva: normal   Vagina:  normal mucosa, normal discharge   pH:    Cervix: multiparous appearance and cervix is closed and long   Adnexa: normal adnexa and no mass, fullness, tenderness   Bony Pelvis: gynecoid  System: Breast:  normal appearance, no masses or tenderness   Skin: normal coloration and turgor, no rashes    Neurologic: oriented, no focal deficits   Extremities: normal strength, tone, and muscle mass   HEENT extra ocular movement intact   Mouth/Teeth mucous membranes moist, pharynx normal without lesions and dental hygiene good   Neck supple and no masses   Cardiovascular: regular rate and rhythm   Respiratory:  appears well, vitals normal, no respiratory distress, acyanotic, normal RR, chest clear, no wheezing, crepitations, rhonchi, normal symmetric air entry   Abdomen: soft, non-tender; bowel sounds normal; no masses,  no  organomegaly   Urinary:       Assessment:    Pregnancy: [id number] Patient Active Problem List   Diagnosis Date Noted  . Previous cesarean delivery affecting pregnancy, antepartum 03/11/2018  . AMA (advanced maternal age) multigravida 35+ 03/11/2018  . Supervision of other normal pregnancy, antepartum 02/08/2018        Plan:     Initial labs drawn. Prenatal vitamins. Problem list reviewed and updated. Rx phenergan provided Genetic Screening discussed: panorama ordered.  Ultrasound discussed; fetal survey: ordered.  Follow up in 4 weeks. 50% of 30 min visit spent on counseling and coordination of care.     Javante Nilsson 03/11/2018

## 2018-03-13 LAB — OBSTETRIC PANEL, INCLUDING HIV
ANTIBODY SCREEN: NEGATIVE
BASOS ABS: 0 10*3/uL (ref 0.0–0.2)
Basos: 0 %
EOS (ABSOLUTE): 0.1 10*3/uL (ref 0.0–0.4)
Eos: 2 %
HEMATOCRIT: 36 % (ref 34.0–46.6)
HEP B S AG: NEGATIVE
HIV Screen 4th Generation wRfx: NONREACTIVE
Hemoglobin: 10.9 g/dL — ABNORMAL LOW (ref 11.1–15.9)
IMMATURE GRANS (ABS): 0 10*3/uL (ref 0.0–0.1)
Immature Granulocytes: 0 %
LYMPHS: 28 %
Lymphocytes Absolute: 1.5 10*3/uL (ref 0.7–3.1)
MCH: 22.8 pg — ABNORMAL LOW (ref 26.6–33.0)
MCHC: 30.3 g/dL — ABNORMAL LOW (ref 31.5–35.7)
MCV: 75 fL — AB (ref 79–97)
MONOCYTES: 7 %
Monocytes Absolute: 0.4 10*3/uL (ref 0.1–0.9)
NEUTROS ABS: 3.3 10*3/uL (ref 1.4–7.0)
Neutrophils: 63 %
PLATELETS: 215 10*3/uL (ref 150–450)
RBC: 4.79 x10E6/uL (ref 3.77–5.28)
RDW: 22.4 % — ABNORMAL HIGH (ref 11.7–15.4)
RPR: NONREACTIVE
RUBELLA: 13.2 {index} (ref >0.99)
Rh Factor: POSITIVE
WBC: 5.3 10*3/uL (ref 3.4–10.8)

## 2018-03-13 LAB — CULTURE, OB URINE

## 2018-03-13 LAB — URINE CULTURE, OB REFLEX: ORGANISM ID, BACTERIA: NO GROWTH

## 2018-03-14 LAB — CYTOLOGY - PAP
Chlamydia: NEGATIVE
Diagnosis: NEGATIVE
HPV (WINDOPATH): NOT DETECTED
Neisseria Gonorrhea: NEGATIVE

## 2018-03-16 ENCOUNTER — Inpatient Hospital Stay (HOSPITAL_COMMUNITY)
Admission: AD | Admit: 2018-03-16 | Discharge: 2018-03-16 | Disposition: A | Discharge status: Home / Self Care | Payer: BLUE CROSS/BLUE SHIELD | Source: Ambulatory Visit | Attending: Family Medicine | Admitting: Family Medicine

## 2018-03-16 ENCOUNTER — Encounter (HOSPITAL_COMMUNITY): Payer: Self-pay | Admitting: *Deleted

## 2018-03-16 DIAGNOSIS — O26891 Other specified pregnancy related conditions, first trimester: Secondary | ICD-10-CM | POA: Diagnosis not present

## 2018-03-16 DIAGNOSIS — O219 Vomiting of pregnancy, unspecified: Secondary | ICD-10-CM | POA: Diagnosis not present

## 2018-03-16 DIAGNOSIS — N898 Other specified noninflammatory disorders of vagina: Secondary | ICD-10-CM

## 2018-03-16 DIAGNOSIS — Z3A11 11 weeks gestation of pregnancy: Secondary | ICD-10-CM | POA: Diagnosis not present

## 2018-03-16 DIAGNOSIS — O210 Mild hyperemesis gravidarum: Secondary | ICD-10-CM | POA: Insufficient documentation

## 2018-03-16 LAB — URINALYSIS, ROUTINE W REFLEX MICROSCOPIC
Bilirubin Urine: NEGATIVE
GLUCOSE, UA: NEGATIVE mg/dL
HGB URINE DIPSTICK: NEGATIVE
KETONES UR: NEGATIVE mg/dL
LEUKOCYTES UA: NEGATIVE
Nitrite: NEGATIVE
PROTEIN: NEGATIVE mg/dL
Specific Gravity, Urine: 1.028 (ref 1.005–1.030)
pH: 5 (ref 5.0–8.0)

## 2018-03-16 LAB — WET PREP, GENITAL
CLUE CELLS WET PREP: NONE SEEN
Sperm: NONE SEEN
Trich, Wet Prep: NONE SEEN
Yeast Wet Prep HPF POC: NONE SEEN

## 2018-03-16 MED ORDER — ONDANSETRON 4 MG PO TBDP: 4.0000 mg | ORAL_TABLET | Freq: Three times a day (TID) | ORAL | 0 refills | Status: DC | PRN

## 2018-03-16 NOTE — MAU Provider Note (Signed)
Chief Complaint: Vaginal Discharge   First Provider Initiated Contact with Patient 03/16/18 1600     SUBJECTIVE HPI: Vickie Baldwin is a 38 y.o. G3P2002 at 7275w4d who presents to [facility] reporting nausea & vaginal discharge. States nausea has worsened this week but is not vomiting. Does not have nausea medication at home and would like prescription for something.  Reports gush of discharge this morning. Discharge was clear and without odor. Has not continued. Denies vaginal itching or irritation. No vaginal bleeding or dysuria. No recent intercourse. Denies abdominal pain.    History reviewed. No pertinent past medical history. OB History  Gravida Para Term Preterm AB Living  3 2 2     2   SAB TAB Ectopic Multiple Live Births          2    # Outcome Date GA Lbr Len/2nd Weight Sex Delivery Anes PTL Lv  3 Current           2 Term 09/30/06     CS-LTranv   LIV  1 Term 10/18/02     Vag-Spont   LIV   Past Surgical History:  Procedure Laterality Date  . CESAREAN SECTION    . TONSILLECTOMY     Social History   Socioeconomic History  . Marital status: Married    Spouse name: Not on file  . Number of children: Not on file  . Years of education: Not on file  . Highest education level: Not on file  Occupational History  . Not on file  Social Needs  . Financial resource strain: Not on file  . Food insecurity:    Worry: Not on file    Inability: Not on file  . Transportation needs:    Medical: Not on file    Non-medical: Not on file  Tobacco Use  . Smoking status: Never Smoker  . Smokeless tobacco: Never Used  Substance and Sexual Activity  . Alcohol use: No    Frequency: Never  . Drug use: No  . Sexual activity: Yes  Lifestyle  . Physical activity:    Days per week: Not on file    Minutes per session: Not on file  . Stress: Not on file  Relationships  . Social connections:    Talks on phone: Not on file    Gets together: Not on file    Attends religious  service: Not on file    Active member of club or organization: Not on file    Attends meetings of clubs or organizations: Not on file    Relationship status: Not on file  . Intimate partner violence:    Fear of current or ex partner: Not on file    Emotionally abused: Not on file    Physically abused: Not on file    Forced sexual activity: Not on file  Other Topics Concern  . Not on file  Social History Narrative  . Not on file   Family History  Problem Relation Age of Onset  . Diabetes Mother   . Hypertension Father   . Diabetes Brother    No current facility-administered medications on file prior to encounter.    Current Outpatient Medications on File Prior to Encounter  Medication Sig Dispense Refill  . Collagen 500 MG CAPS Take 1 capsule by mouth daily.    . Prenat-Fe Poly-Methfol-FA-DHA (VITAFOL ULTRA) 29-0.6-0.4-200 MG CAPS Take 1 tablet by mouth daily. 30 capsule 12  . promethazine (PHENERGAN) 25 MG tablet Take 1 tablet (25  mg total) by mouth every 6 (six) hours as needed for nausea or vomiting. 30 tablet 1   No Known Allergies  I have reviewed patient's Past Medical Hx, Surgical Hx, Family Hx, Social Hx, medications and allergies.   Review of Systems  Constitutional: Negative.   Gastrointestinal: Positive for nausea. Negative for abdominal pain and vomiting.  Genitourinary: Positive for vaginal discharge. Negative for dysuria and vaginal bleeding.    OBJECTIVE Patient Vitals for the past 24 hrs:  BP Temp Pulse Resp Height Weight  03/16/18 1543 120/67 98.1 F (36.7 C) 88 18 5\' 2"  (1.575 m) 88.9 kg   Constitutional: Well-developed, well-nourished female in no acute distress.  Cardiovascular: normal rate & rhythm, no murmur Respiratory: normal rate and effort. Lung sounds clear throughout GI: Abd soft, non-tender, Pos BS x 4. No guarding or rebound tenderness MS: Extremities nontender, no edema, normal ROM Neurologic: Alert and oriented x 4.  GU:     SPECULUM  EXAM: NEFG, physiologic discharge, no blood noted, cervix clean     LAB RESULTS Results for orders placed or performed during the hospital encounter of 03/16/18 (from the past 24 hour(s))  Urinalysis, Routine w reflex microscopic     Status: Abnormal   Collection Time: 03/16/18  3:52 PM  Result Value Ref Range   Color, Urine AMBER (A) YELLOW   APPearance CLOUDY (A) CLEAR   Specific Gravity, Urine 1.028 1.005 - 1.030   pH 5.0 5.0 - 8.0   Glucose, UA NEGATIVE NEGATIVE mg/dL   Hgb urine dipstick NEGATIVE NEGATIVE   Bilirubin Urine NEGATIVE NEGATIVE   Ketones, ur NEGATIVE NEGATIVE mg/dL   Protein, ur NEGATIVE NEGATIVE mg/dL   Nitrite NEGATIVE NEGATIVE   Leukocytes, UA NEGATIVE NEGATIVE  Wet prep, genital     Status: Abnormal   Collection Time: 03/16/18  4:15 PM  Result Value Ref Range   Yeast Wet Prep HPF POC NONE SEEN NONE SEEN   Trich, Wet Prep NONE SEEN NONE SEEN   Clue Cells Wet Prep HPF POC NONE SEEN NONE SEEN   WBC, Wet Prep HPF POC FEW (A) NONE SEEN   Sperm NONE SEEN     IMAGING No results found.  MAU COURSE Orders Placed This Encounter  Procedures  . Wet prep, genital  . Urinalysis, Routine w reflex microscopic  . Discharge patient   Meds ordered this encounter  Medications  . ondansetron (ZOFRAN ODT) 4 MG disintegrating tablet    Sig: Take 1 tablet (4 mg total) by mouth every 8 (eight) hours as needed for nausea or vomiting.    Dispense:  15 tablet    Refill:  0    Order Specific Question:   Supervising Provider    Answer:   Samara Snide    MDM FHT present via doppler Physiologic discharge seen on exam. GC & CT negative on pap last week. Wet prep today negative.   Video interpreter used for evaluation today  ASSESSMENT 1. Nausea and vomiting during pregnancy prior to [redacted] weeks gestation   2. Vaginal discharge during pregnancy in first trimester   3. [redacted] weeks gestation of pregnancy     PLAN Discharge home in stable condition. Discussed  reasons to return to [facility]U Rx zofran  Allergies as of 03/16/2018   No Known Allergies     Medication List    STOP taking these medications   dicyclomine 20 MG tablet Commonly known as:  BENTYL   pantoprazole 20 MG tablet Commonly known as:  PROTONIX   prenatal multivitamin Tabs tablet     TAKE these medications   Collagen 500 MG Caps Take 1 capsule by mouth daily.   ondansetron 4 MG disintegrating tablet Commonly known as:  ZOFRAN ODT Take 1 tablet (4 mg total) by mouth every 8 (eight) hours as needed for nausea or vomiting.   promethazine 25 MG tablet Commonly known as:  PHENERGAN Take 1 tablet (25 mg total) by mouth every 6 (six) hours as needed for nausea or vomiting.   VITAFOL ULTRA 29-0.6-0.4-200 MG Caps Take 1 tablet by mouth daily.        Judeth HornLawrence, Oluwaseun Cremer, NP 03/17/2018  2:24 PM

## 2018-03-16 NOTE — MAU Note (Signed)
Pt reports she is having a lot of nausea since she had been pregnant got worse last night . Also c/o water clear discharge since this morning with some mild  Back pain

## 2018-03-16 NOTE — Discharge Instructions (Signed)
Morning Sickness    Morning sickness is when you feel sick to your stomach (nauseous) during pregnancy. You may feel sick to your stomach and throw up (vomit). You may feel sick in the morning, but you can feel this way at any time of day. Some women feel very sick to their stomach and cannot stop throwing up (hyperemesis gravidarum).  Follow these instructions at home:  Medicines   Take over-the-counter and prescription medicines only as told by your doctor. Do not take any medicines until you talk with your doctor about them first.   Taking multivitamins before getting pregnant can stop or lessen the harshness of morning sickness.  Eating and drinking   Eat dry toast or crackers before getting out of bed.   Eat 5 or 6 small meals a day.   Eat dry and bland foods like rice and baked potatoes.   Do not eat greasy, fatty, or spicy foods.   Have someone cook for you if the smell of food causes you to feel sick or throw up.   If you feel sick to your stomach after taking prenatal vitamins, take them at night or with a snack.   Eat protein when you need a snack. Nuts, yogurt, and cheese are good choices.   Drink fluids throughout the day.   Try ginger ale made with real ginger, ginger tea made from fresh grated ginger, or ginger candies.  General instructions   Do not use any products that have nicotine or tobacco in them, such as cigarettes and e-cigarettes. If you need help quitting, ask your doctor.   Use an air purifier to keep the air in your house free of smells.   Get lots of fresh air.   Try to avoid smells that make you feel sick.   Try:  ? Wearing a bracelet that is used for seasickness (acupressure wristband).  ? Going to a doctor who puts thin needles into certain body points (acupuncture) to improve how you feel.  Contact a doctor if:   You need medicine to feel better.   You feel dizzy or light-headed.   You are losing weight.  Get help right away if:   You feel very sick to your  stomach and cannot stop throwing up.   You pass out (faint).   You have very bad pain in your belly.  Summary   Morning sickness is when you feel sick to your stomach (nauseous) during pregnancy.   You may feel sick in the morning, but you can feel this way at any time of day.   Making some changes to what you eat may help your symptoms go away.  This information is not intended to replace advice given to you by your health care provider. Make sure you discuss any questions you have with your health care provider.  Document Released: 03/30/2004 Document Revised: 03/23/2016 Document Reviewed: 03/23/2016  Elsevier Interactive Patient Education  2019 [employer] Inc[employer]

## 2018-03-18 ENCOUNTER — Encounter: Payer: Self-pay | Admitting: Obstetrics and Gynecology

## 2018-03-20 LAB — CYSTIC FIBROSIS MUTATION 97: GENE DIS ANAL CARRIER INTERP BLD/T-IMP: NOT DETECTED

## 2018-03-20 LAB — SMN1 COPY NUMBER ANALYSIS (SMA CARRIER SCREENING)

## 2018-03-20 LAB — HEMOGLOBIN A1C
Est. average glucose Bld gHb Est-mCnc: 97 mg/dL
Hgb A1c MFr Bld: 5 % (ref 4.8–5.6)

## 2018-03-20 LAB — HEMOGLOBINOPATHY EVALUATION
HGB C: 0 %
HGB S: 0 %
HGB VARIANT: 0 %
Hemoglobin A2 Quantitation: 2.2 % (ref 1.8–3.2)
Hemoglobin F Quantitation: 0 % (ref 0.0–2.0)
Hgb A: 97.8 % (ref 96.4–98.8)

## 2018-03-26 ENCOUNTER — Telehealth: Payer: Self-pay

## 2018-03-26 NOTE — Telephone Encounter (Signed)
Returned call to advise of panorama results, no answer, left vm.

## 2018-03-26 NOTE — Telephone Encounter (Signed)
Returned call and advised of results 

## 2018-04-08 ENCOUNTER — Encounter: Payer: Self-pay | Admitting: Obstetrics and Gynecology

## 2018-04-08 ENCOUNTER — Ambulatory Visit (INDEPENDENT_AMBULATORY_CARE_PROVIDER_SITE_OTHER): Payer: BLUE CROSS/BLUE SHIELD | Admitting: Obstetrics and Gynecology

## 2018-04-08 VITALS — BP 116/77 | HR 111 | Wt 197.4 lb

## 2018-04-08 DIAGNOSIS — O99212 Obesity complicating pregnancy, second trimester: Secondary | ICD-10-CM

## 2018-04-08 DIAGNOSIS — Z3480 Encounter for supervision of other normal pregnancy, unspecified trimester: Secondary | ICD-10-CM

## 2018-04-08 DIAGNOSIS — Z3482 Encounter for supervision of other normal pregnancy, second trimester: Secondary | ICD-10-CM

## 2018-04-08 DIAGNOSIS — O09522 Supervision of elderly multigravida, second trimester: Secondary | ICD-10-CM

## 2018-04-08 DIAGNOSIS — O34219 Maternal care for unspecified type scar from previous cesarean delivery: Secondary | ICD-10-CM

## 2018-04-08 MED ORDER — FAMOTIDINE 20 MG PO TABS: 20.0000 mg | ORAL_TABLET | Freq: Two times a day (BID) | ORAL | 3 refills | Status: DC

## 2018-04-08 MED ORDER — PRENATE MINI 18-0.6-0.4-350 MG PO CAPS: 1.0000 | ORAL_CAPSULE | Freq: Every day | ORAL | 12 refills | Status: AC

## 2018-04-08 NOTE — Progress Notes (Signed)
Pt is here for ROB. [redacted]w[redacted]d.  

## 2018-04-08 NOTE — Progress Notes (Signed)
   PRENATAL VISIT NOTE  Subjective:  Vickie Baldwin is a 38 y.o. G3P2002 at [redacted]w[redacted]d being seen today for ongoing prenatal care.  She is currently monitored for the following issues for this high-risk pregnancy and has Supervision of other normal pregnancy, antepartum; Previous cesarean delivery affecting pregnancy, antepartum; AMA (advanced maternal age) multigravida 35+; and Obesity affecting pregnancy on their problem list.  Patient reports no complaints.  Contractions: Not present. Vag. Bleeding: None.   . Denies leaking of fluid.   The following portions of the patient's history were reviewed and updated as appropriate: allergies, current medications, past family history, past medical history, past social history, past surgical history and problem list. Problem list updated.  Objective:   Vitals:   04/08/18 1302  BP: 116/77  Pulse: (!) 111  Weight: 197 lb 6.4 oz (89.5 kg)    Fetal Status: Fetal Heart Rate (bpm): 156         General:  Alert, oriented and cooperative. Patient is in no acute distress.  Skin: Skin is warm and dry. No rash noted.   Cardiovascular: Normal heart rate noted  Respiratory: Normal respiratory effort, no problems with respiration noted  Abdomen: Soft, gravid, appropriate for gestational age.  Pain/Pressure: Absent     Pelvic: Cervical exam deferred        Extremities: Normal range of motion.  Edema: None  Mental Status: Normal mood and affect. Normal behavior. Normal judgment and thought content.   Assessment and Plan:  Pregnancy: G3P2002 at [redacted]w[redacted]d  1. Supervision of other normal pregnancy, antepartum Patient is doing well without complaints AFP next visit Anatomy ultrasound scheduled in march  2. Previous cesarean delivery affecting pregnancy, antepartum Information provided  3. Obesity affecting pregnancy in second trimester   4. Multigravida of advanced maternal age in second trimester   Preterm labor symptoms and general obstetric precautions  including but not limited to vaginal bleeding, contractions, leaking of fluid and fetal movement were reviewed in detail with the patient. Please refer to After Visit Summary for other counseling recommendations.  Return in about 4 weeks (around 05/06/2018) for ROB.  Future Appointments  Date Time Provider Department Center  05/07/2018 10:15 AM [facility] [place] [facility]    Catalina Antigua, MD

## 2018-04-08 NOTE — Patient Instructions (Signed)
Use Lactaid milk (or lactose free dairy products   Vaginal Birth After Cesarean Delivery  Vaginal birth after cesarean delivery (VBAC) is giving birth vaginally after previously delivering a baby through a cesarean section (C-section). A VBAC may be a safe option for you, depending on your health and other factors. It is important to discuss VBAC with your health care provider early in your pregnancy so you can understand the risks, benefits, and options. Having these discussions early will give you time to make your birth plan. Who are the best candidates for VBAC? The best candidates for VBAC are women who:  Have had one or two prior cesarean deliveries, and the incision made during the delivery was horizontal (low transverse).  Do not have a vertical (classical) scar on their uterus.  Have not had a tear in the wall of their uterus (uterine rupture).  Plan to have more pregnancies. A VBAC is also more likely to be successful:  In women who have previously given birth vaginally.  When labor starts by itself (spontaneously) before the due date. What are the benefits of VBAC? The benefits of delivering your baby vaginally instead of by a cesarean delivery include:  A shorter hospital stay.  A faster recovery time.  Less pain.  Avoiding risks associated with major surgery, such as infection and blood clots.  Less blood loss and less need for donated blood (transfusions). What are the risks of VBAC? The main risk of attempting a VBAC is that it may fail, forcing your health care provider to deliver your baby by a C-section. Other risks are rare and include:  Tearing (rupture) of the scar from a past cesarean delivery.  Other risks associated with vaginal deliveries. If a repeat cesarean delivery is needed, the risks include:  Blood loss.  Infection.  Blood clot.  Damage to surrounding organs.  Removal of the uterus (hysterectomy), if it is damaged.  Placenta  problems in future pregnancies. What else should I know about my options? Delivering a baby through a VBAC is similar to having a normal spontaneous vaginal delivery. Therefore, it is safe:  To try with twins.  For your health care provider to try to turn the baby from a breech position (external cephalic version) during labor.  With epidural analgesia for pain relief. Consider where you would like to deliver your baby. VBAC should be attempted in facilities where an emergency cesarean delivery can be performed. VBAC is not recommended for home births. Any changes in your health or your baby's health during your pregnancy may make it necessary to change your initial decision about VBAC. Your health care provider may recommend that you do not attempt a VBAC if:  Your baby's suspected weight is 8.8 lb (4 kg) or more.  You have preeclampsia. This is a condition that causes high blood pressure along with other symptoms, such as swelling and headaches.  You will have VBAC less than 19 months after your cesarean delivery.  You are past your due date.  You need to have labor started (induced) because your cervix is not ready for labor (unfavorable). Where to find more information  American Pregnancy Association: americanpregnancy.org  [employer]: acog.org Summary  Vaginal birth after cesarean delivery (VBAC) is giving birth vaginally after previously delivering a baby through a cesarean section (C-section). A VBAC may be a safe option for you, depending on your health and other factors.  Discuss VBAC with your health care provider early in your  pregnancy so you can understand the risks, benefits, options, and have plenty of time to make your birth plan.  The main risk of attempting a VBAC is that it may fail, forcing your health care provider to deliver your baby by a C-section. Other risks are rare. This information is not intended to replace  advice given to you by your health care provider. Make sure you discuss any questions you have with your health care provider. Document Released: 08/13/2006 Document Revised: 05/30/2016 Document Reviewed: 05/30/2016 Elsevier Interactive Patient Education  2019 [employer].

## 2018-04-19 ENCOUNTER — Other Ambulatory Visit: Payer: Self-pay

## 2018-04-19 MED ORDER — PRENATAL VITAMINS 28-0.8 MG PO TABS: 1.0000 | ORAL_TABLET | Freq: Every day | ORAL | 12 refills | Status: DC

## 2018-05-06 ENCOUNTER — Encounter: Payer: Self-pay | Admitting: Obstetrics and Gynecology

## 2018-05-06 ENCOUNTER — Ambulatory Visit (INDEPENDENT_AMBULATORY_CARE_PROVIDER_SITE_OTHER): Payer: BLUE CROSS/BLUE SHIELD | Admitting: Obstetrics and Gynecology

## 2018-05-06 ENCOUNTER — Telehealth: Payer: Self-pay

## 2018-05-06 VITALS — BP 123/78 | HR 103 | Wt 201.3 lb

## 2018-05-06 DIAGNOSIS — O99212 Obesity complicating pregnancy, second trimester: Secondary | ICD-10-CM

## 2018-05-06 DIAGNOSIS — Z3480 Encounter for supervision of other normal pregnancy, unspecified trimester: Secondary | ICD-10-CM

## 2018-05-06 DIAGNOSIS — O09522 Supervision of elderly multigravida, second trimester: Secondary | ICD-10-CM

## 2018-05-06 DIAGNOSIS — O34219 Maternal care for unspecified type scar from previous cesarean delivery: Secondary | ICD-10-CM

## 2018-05-06 DIAGNOSIS — E669 Obesity, unspecified: Secondary | ICD-10-CM

## 2018-05-06 DIAGNOSIS — Z3A18 18 weeks gestation of pregnancy: Secondary | ICD-10-CM

## 2018-05-06 NOTE — Telephone Encounter (Addendum)
Pt stopped before checking out today to inform me she was unable to pick up PNV's and Pepcid rx's. Contacted the pharmacy, they were running the prescriptions under [employer] but not under [facility]. They will resubmit under [facility] now.  Pt will contact the office if she needs further assistance.

## 2018-05-06 NOTE — Progress Notes (Signed)
   PRENATAL VISIT NOTE  Subjective:  Vickie Baldwin is a 38 y.o. G3P2002 at 103w6d being seen today for ongoing prenatal care.  She is currently monitored for the following issues for this high-risk pregnancy and has Supervision of other normal pregnancy, antepartum; Previous cesarean delivery affecting pregnancy, antepartum; AMA (advanced maternal age) multigravida 35+; and Obesity affecting pregnancy on their problem list.  Patient reports no complaints.  Contractions: Not present. Vag. Bleeding: None.   . Denies leaking of fluid.   The following portions of the patient's history were reviewed and updated as appropriate: allergies, current medications, past family history, past medical history, past social history, past surgical history and problem list. Problem list updated.  Objective:   Vitals:   05/06/18 1400  BP: 123/78  Pulse: (!) 103  Weight: 201 lb 4.8 oz (91.3 kg)    Fetal Status: Fetal Heart Rate (bpm): 148         General:  Alert, oriented and cooperative. Patient is in no acute distress.  Skin: Skin is warm and dry. No rash noted.   Cardiovascular: Normal heart rate noted  Respiratory: Normal respiratory effort, no problems with respiration noted  Abdomen: Soft, gravid, appropriate for gestational age.  Pain/Pressure: Absent     Pelvic: Cervical exam deferred        Extremities: Normal range of motion.  Edema: None  Mental Status: Normal mood and affect. Normal behavior. Normal judgment and thought content.   Assessment and Plan:  Pregnancy: G3P2002 at [redacted]w[redacted]d  1. Supervision of other normal pregnancy, antepartum Patient is doing well without complaints AFP today Anatomy ultrasound tomorrow  2. Multigravida of advanced maternal age in second trimester Low risks NIPS  3. Obesity affecting pregnancy in second trimester   4. Previous cesarean delivery affecting pregnancy, antepartum Information on TOLAC provided  Preterm labor symptoms and general obstetric  precautions including but not limited to vaginal bleeding, contractions, leaking of fluid and fetal movement were reviewed in detail with the patient. Please refer to After Visit Summary for other counseling recommendations.  Return in about 4 weeks (around 06/03/2018) for ROB.  Future Appointments  Date Time Provider Department Center  05/07/2018 10:15 AM [facility] [place] 4 [facility]    Catalina Antigua, MD

## 2018-05-06 NOTE — Telephone Encounter (Signed)
-----   Message from Catalina Antigua, MD sent at 05/06/2018  2:13 PM EST ----- Please contact patient pharmacy regarding prenatal vitamins. Patient states that they were not covered by her medicaid and had to pay out of pocket. Please let me know if I need to prescribe a different brand and which one  Thanks  [place]

## 2018-05-07 ENCOUNTER — Other Ambulatory Visit (HOSPITAL_COMMUNITY): Payer: Self-pay | Admitting: *Deleted

## 2018-05-07 ENCOUNTER — Ambulatory Visit (HOSPITAL_COMMUNITY)
Admission: RE | Admit: 2018-05-07 | Discharge: 2018-05-07 | Disposition: A | Discharge status: Home / Self Care | Payer: BLUE CROSS/BLUE SHIELD | Source: Ambulatory Visit | Attending: Obstetrics and Gynecology | Admitting: Obstetrics and Gynecology

## 2018-05-07 ENCOUNTER — Other Ambulatory Visit: Payer: Self-pay | Admitting: Obstetrics and Gynecology

## 2018-05-07 DIAGNOSIS — Z363 Encounter for antenatal screening for malformations: Secondary | ICD-10-CM | POA: Diagnosis not present

## 2018-05-07 DIAGNOSIS — Z3480 Encounter for supervision of other normal pregnancy, unspecified trimester: Secondary | ICD-10-CM

## 2018-05-07 DIAGNOSIS — O3412 Maternal care for benign tumor of corpus uteri, second trimester: Secondary | ICD-10-CM | POA: Diagnosis not present

## 2018-05-07 DIAGNOSIS — O09522 Supervision of elderly multigravida, second trimester: Secondary | ICD-10-CM | POA: Diagnosis not present

## 2018-05-07 DIAGNOSIS — Z3A19 19 weeks gestation of pregnancy: Secondary | ICD-10-CM

## 2018-05-07 DIAGNOSIS — O09529 Supervision of elderly multigravida, unspecified trimester: Secondary | ICD-10-CM

## 2018-05-07 DIAGNOSIS — O99212 Obesity complicating pregnancy, second trimester: Secondary | ICD-10-CM | POA: Diagnosis not present

## 2018-05-08 LAB — AFP, SERUM, OPEN SPINA BIFIDA
AFP MoM: 1.18
AFP Value: 46.2 ng/mL
Gest. Age on Collection Date: 18.6 weeks
Maternal Age At EDD: 38.9 yr
OSBR Risk 1 IN: 6916
Test Results:: NEGATIVE
Weight: 201 [lb_av]

## 2018-05-24 ENCOUNTER — Ambulatory Visit (INDEPENDENT_AMBULATORY_CARE_PROVIDER_SITE_OTHER): Payer: Self-pay | Admitting: Family Medicine

## 2018-05-24 ENCOUNTER — Encounter (HOSPITAL_COMMUNITY): Payer: Self-pay | Admitting: *Deleted

## 2018-05-24 ENCOUNTER — Inpatient Hospital Stay (HOSPITAL_COMMUNITY)
Admission: EM | Admit: 2018-05-24 | Discharge: 2018-05-24 | Disposition: A | Discharge status: Home / Self Care | Payer: BLUE CROSS/BLUE SHIELD | Attending: Obstetrics & Gynecology | Admitting: Obstetrics & Gynecology

## 2018-05-24 ENCOUNTER — Other Ambulatory Visit: Payer: Self-pay

## 2018-05-24 DIAGNOSIS — Z79899 Other long term (current) drug therapy: Secondary | ICD-10-CM | POA: Insufficient documentation

## 2018-05-24 DIAGNOSIS — Z833 Family history of diabetes mellitus: Secondary | ICD-10-CM | POA: Diagnosis not present

## 2018-05-24 DIAGNOSIS — R21 Rash and other nonspecific skin eruption: Secondary | ICD-10-CM | POA: Diagnosis present

## 2018-05-24 DIAGNOSIS — Z3A21 21 weeks gestation of pregnancy: Secondary | ICD-10-CM | POA: Diagnosis not present

## 2018-05-24 DIAGNOSIS — O9989 Other specified diseases and conditions complicating pregnancy, childbirth and the puerperium: Secondary | ICD-10-CM | POA: Diagnosis not present

## 2018-05-24 DIAGNOSIS — Z5321 Procedure and treatment not carried out due to patient leaving prior to being seen by health care provider: Secondary | ICD-10-CM

## 2018-05-24 DIAGNOSIS — O34219 Maternal care for unspecified type scar from previous cesarean delivery: Secondary | ICD-10-CM | POA: Insufficient documentation

## 2018-05-24 DIAGNOSIS — O99212 Obesity complicating pregnancy, second trimester: Secondary | ICD-10-CM

## 2018-05-24 DIAGNOSIS — O09522 Supervision of elderly multigravida, second trimester: Secondary | ICD-10-CM

## 2018-05-24 DIAGNOSIS — Z3480 Encounter for supervision of other normal pregnancy, unspecified trimester: Secondary | ICD-10-CM

## 2018-05-24 LAB — URINALYSIS, ROUTINE W REFLEX MICROSCOPIC
Bilirubin Urine: NEGATIVE
Glucose, UA: NEGATIVE mg/dL
Hgb urine dipstick: NEGATIVE
Ketones, ur: NEGATIVE mg/dL
Leukocytes,Ua: NEGATIVE
Nitrite: NEGATIVE
Protein, ur: NEGATIVE mg/dL
Specific Gravity, Urine: 1.015 (ref 1.005–1.030)
pH: 7 (ref 5.0–8.0)

## 2018-05-24 MED ORDER — VALACYCLOVIR HCL 500 MG PO TABS: 1000.0000 mg | ORAL_TABLET | Freq: Two times a day (BID) | ORAL | 3 refills | Status: AC

## 2018-05-24 MED ORDER — LIDOCAINE 5 % EX OINT: 1.0000 "application " | TOPICAL_OINTMENT | CUTANEOUS | 0 refills | Status: DC | PRN

## 2018-05-24 NOTE — Progress Notes (Addendum)
Left  wo being seen Insurance issues

## 2018-05-24 NOTE — MAU Provider Note (Addendum)
History     CSN: [id number]  Arrival date and time: 05/24/18 1216   First Provider Initiated Contact with Patient 05/24/18 1327      Chief Complaint  Patient presents with  . Vaginal Itching   Patient presents at [redacted]w[redacted]d with rash on perineum that started 2 days ago States that rash is itchy and "feels inflamed" Hurts when she wipes Pain and itching have been so uncomfortable, they have woken her from sleep Had a similar rash a few years ago in her [place] country  Was given a cream and it got better, but has never had it looked at States that she was told it might be hemorrhoids No history of constipation, rectal bleeding or pain with bowel movements No change in symptoms with prior pregnancies   No past medical history on file.  Past Surgical History:  Procedure Laterality Date  . CESAREAN SECTION    . TONSILLECTOMY      Family History  Problem Relation Age of Onset  . Diabetes Mother   . Hypertension Father   . Diabetes Brother     Social History   Tobacco Use  . Smoking status: Never Smoker  . Smokeless tobacco: Never Used  Substance Use Topics  . Alcohol use: No    Frequency: Never  . Drug use: No    Allergies: No Known Allergies  Medications Prior to Admission  Medication Sig Dispense Refill Last Dose  . Collagen 500 MG CAPS Take 1 capsule by mouth daily.   Not Taking  . famotidine (PEPCID) 20 MG tablet Take 1 tablet (20 mg total) by mouth 2 (two) times daily. (Patient not taking: Reported on 05/06/2018) 60 tablet 3 Not Taking  . ondansetron (ZOFRAN ODT) 4 MG disintegrating tablet Take 1 tablet (4 mg total) by mouth every 8 (eight) hours as needed for nausea or vomiting. (Patient not taking: Reported on 05/06/2018) 15 tablet 0 Not Taking  . Prenat-Fe Poly-Methfol-FA-DHA (VITAFOL ULTRA) 29-0.6-0.4-200 MG CAPS Take 1 tablet by mouth daily. (Patient not taking: Reported on 05/06/2018) 30 capsule 12 Not Taking  . Prenat-FeCbn-FeAsp-Meth-FA-DHA (PRENATE MINI)  18-0.6-0.4-350 MG CAPS Take 1 tablet by mouth daily. (Patient not taking: Reported on 05/06/2018) 30 capsule 12 Not Taking  . Prenatal Vit-Fe Fumarate-FA (PRENATAL VITAMINS) 28-0.8 MG TABS Take 1 tablet by mouth daily. 30 tablet 12 Taking  . promethazine (PHENERGAN) 25 MG tablet Take 1 tablet (25 mg total) by mouth every 6 (six) hours as needed for nausea or vomiting. (Patient not taking: Reported on 05/06/2018) 30 tablet 1 Not Taking    Review of Systems  Constitutional: Positive for fatigue. Negative for chills and fever.  Gastrointestinal: Negative for abdominal pain, anal bleeding, blood in stool and rectal pain.  Musculoskeletal: Positive for myalgias.  All other systems reviewed and are negative.  Physical Exam   Blood pressure (!) 120/58, pulse (!) 102, temperature 98.9 F (37.2 C), temperature source Oral, resp. rate 18, weight 91.3 kg, last menstrual period 12/25/2017, SpO2 100 %.  Physical Exam  Constitutional: She is oriented to person, place, and time. She appears well-developed and well-nourished. No distress.  HENT:  Head: Normocephalic and atraumatic.  Eyes: Pupils are equal, round, and reactive to light. Conjunctivae and EOM are normal. Right eye exhibits no discharge. Left eye exhibits no discharge. No scleral icterus.  Cardiovascular: Normal rate and regular rhythm.  Respiratory: Effort normal. No respiratory distress.  Genitourinary:    Genitourinary Comments: Perineum with area of erythema and skin breakdown  No  vesicular rash, but areas of punched out lesions Tender with swab   Neurological: She is alert and oriented to person, place, and time. No cranial nerve deficit.  Skin: She is not diaphoretic.  Psychiatric: She has a normal mood and affect. Her behavior is normal. Judgment and thought content normal.    MAU Course  Procedures  None  Assessment and Plan  Rash of perineum - Plan: Discharge patient - rash suspicious but not pathognomonic for HSV2 -  discussed possible diagnosis with patient, who prefers to be treated now, rather than wait for results - will need to call patient with Arabic interpreter to discuss results and implications for delivery - valtrex and lidocaine ointment prescribed  - follow up after results   Gwenevere Abbot 05/24/2018, 2:10 PM

## 2018-05-24 NOTE — MAU Note (Signed)
Last couple days has been having itching/ burning between anus and vagina. Today, she noted a pimple in right armpit, and she is worried, no drainage. No bleeding or d/c.

## 2018-05-26 LAB — HSV CULTURE AND TYPING

## 2018-05-29 ENCOUNTER — Telehealth: Payer: Self-pay | Admitting: Family Medicine

## 2018-05-29 DIAGNOSIS — A609 Anogenital herpesviral infection, unspecified: Secondary | ICD-10-CM | POA: Insufficient documentation

## 2018-05-29 NOTE — Telephone Encounter (Signed)
Called patient with interpreter (ID [phone]) to discuss positive HSV result.  No answer. Left message asking patient to call clinic.   Gwenevere Abbot, MD 05/29/2018 11:00 AM

## 2018-06-03 ENCOUNTER — Other Ambulatory Visit: Payer: Self-pay

## 2018-06-03 ENCOUNTER — Encounter: Payer: Self-pay | Admitting: Obstetrics and Gynecology

## 2018-06-03 ENCOUNTER — Ambulatory Visit (INDEPENDENT_AMBULATORY_CARE_PROVIDER_SITE_OTHER): Payer: BLUE CROSS/BLUE SHIELD | Admitting: Obstetrics and Gynecology

## 2018-06-03 VITALS — BP 106/72 | HR 98 | Wt 202.7 lb

## 2018-06-03 DIAGNOSIS — Z3480 Encounter for supervision of other normal pregnancy, unspecified trimester: Secondary | ICD-10-CM

## 2018-06-03 DIAGNOSIS — Z3A22 22 weeks gestation of pregnancy: Secondary | ICD-10-CM

## 2018-06-03 DIAGNOSIS — O98811 Other maternal infectious and parasitic diseases complicating pregnancy, first trimester: Secondary | ICD-10-CM

## 2018-06-03 DIAGNOSIS — Z23 Encounter for immunization: Secondary | ICD-10-CM | POA: Diagnosis not present

## 2018-06-03 DIAGNOSIS — O34219 Maternal care for unspecified type scar from previous cesarean delivery: Secondary | ICD-10-CM

## 2018-06-03 DIAGNOSIS — O09522 Supervision of elderly multigravida, second trimester: Secondary | ICD-10-CM

## 2018-06-03 DIAGNOSIS — A609 Anogenital herpesviral infection, unspecified: Secondary | ICD-10-CM

## 2018-06-03 MED ORDER — VALACYCLOVIR HCL 500 MG PO TABS: 500.0000 mg | ORAL_TABLET | Freq: Two times a day (BID) | ORAL | 6 refills | Status: DC

## 2018-06-03 MED ORDER — COMFORT FIT MATERNITY SUPP LG MISC: 0 refills | Status: DC

## 2018-06-03 NOTE — Progress Notes (Signed)
ROB.  TDAP given in LD, tolerated well.

## 2018-06-03 NOTE — Progress Notes (Signed)
   PRENATAL VISIT NOTE  Subjective:  Vickie Baldwin is a 38 y.o. G3P2002 at [redacted]w[redacted]d being seen today for ongoing prenatal care.  She is currently monitored for the following issues for this high-risk pregnancy and has Supervision of other normal pregnancy, antepartum; Previous cesarean delivery affecting pregnancy, antepartum; AMA (advanced maternal age) multigravida 35+; Obesity affecting pregnancy; and HSV (herpes simplex virus) anogenital infection on their problem list.  Patient reports no complaints.  Contractions: Not present. Vag. Bleeding: None.  Movement: Present. Denies leaking of fluid.   The following portions of the patient's history were reviewed and updated as appropriate: allergies, current medications, past family history, past medical history, past social history, past surgical history and problem list.   Objective:   Vitals:   06/03/18 1311  BP: 106/72  Pulse: 98  Weight: 202 lb 11.2 oz (91.9 kg)    Fetal Status: Fetal Heart Rate (bpm): 154 Fundal Height: 23 cm Movement: Present     General:  Alert, oriented and cooperative. Patient is in no acute distress.  Skin: Skin is warm and dry. No rash noted.   Cardiovascular: Normal heart rate noted  Respiratory: Normal respiratory effort, no problems with respiration noted  Abdomen: Soft, gravid, appropriate for gestational age.  Pain/Pressure: Absent     Pelvic: Cervical exam deferred        Extremities: Normal range of motion.  Edema: None  Mental Status: Normal mood and affect. Normal behavior. Normal judgment and thought content.   Assessment and Plan:  Pregnancy: G3P2002 at [redacted]w[redacted]d 1. Supervision of other normal pregnancy, antepartum Patient is doing well without complaints She is requesting a maternity support belt to assist with occasional back pain and lower pelvic pain Patient provided with BP cuff and understands that next ROB visit will be a virtual visit Follow up ultrasound scheduled tomorrow Patient on  suppressive therapy due to recent HSV outbreak  2. Multigravida of advanced maternal age in second trimester Negative screening  3. Previous cesarean delivery affecting pregnancy, antepartum   Preterm labor symptoms and general obstetric precautions including but not limited to vaginal bleeding, contractions, leaking of fluid and fetal movement were reviewed in detail with the patient. Please refer to After Visit Summary for other counseling recommendations.   Return in about 4 weeks (around 07/01/2018) for ROB, telehealth.  Future Appointments  Date Time Provider Department Center  06/04/2018 10:30 AM [facility] [place] 1 [facility]    Catalina Antigua, MD

## 2018-06-04 ENCOUNTER — Other Ambulatory Visit (HOSPITAL_COMMUNITY): Payer: Self-pay | Admitting: *Deleted

## 2018-06-04 ENCOUNTER — Ambulatory Visit (HOSPITAL_COMMUNITY)
Admission: RE | Admit: 2018-06-04 | Discharge: 2018-06-04 | Disposition: A | Discharge status: Home / Self Care | Payer: BLUE CROSS/BLUE SHIELD | Source: Ambulatory Visit | Attending: Obstetrics and Gynecology | Admitting: Obstetrics and Gynecology

## 2018-06-04 DIAGNOSIS — Z362 Encounter for other antenatal screening follow-up: Secondary | ICD-10-CM | POA: Diagnosis not present

## 2018-06-04 DIAGNOSIS — O99212 Obesity complicating pregnancy, second trimester: Secondary | ICD-10-CM

## 2018-06-04 DIAGNOSIS — O09529 Supervision of elderly multigravida, unspecified trimester: Secondary | ICD-10-CM | POA: Insufficient documentation

## 2018-06-04 DIAGNOSIS — O09522 Supervision of elderly multigravida, second trimester: Secondary | ICD-10-CM

## 2018-06-04 DIAGNOSIS — O3412 Maternal care for benign tumor of corpus uteri, second trimester: Secondary | ICD-10-CM | POA: Diagnosis not present

## 2018-06-04 DIAGNOSIS — Z3A23 23 weeks gestation of pregnancy: Secondary | ICD-10-CM

## 2018-06-04 DIAGNOSIS — O09523 Supervision of elderly multigravida, third trimester: Secondary | ICD-10-CM

## 2018-07-01 ENCOUNTER — Other Ambulatory Visit: Payer: Self-pay

## 2018-07-01 ENCOUNTER — Ambulatory Visit (INDEPENDENT_AMBULATORY_CARE_PROVIDER_SITE_OTHER): Payer: BLUE CROSS/BLUE SHIELD | Admitting: Advanced Practice Midwife

## 2018-07-01 VITALS — BP 104/68 | HR 81

## 2018-07-01 DIAGNOSIS — B009 Herpesviral infection, unspecified: Secondary | ICD-10-CM

## 2018-07-01 DIAGNOSIS — Z3480 Encounter for supervision of other normal pregnancy, unspecified trimester: Secondary | ICD-10-CM

## 2018-07-01 DIAGNOSIS — Z3A26 26 weeks gestation of pregnancy: Secondary | ICD-10-CM

## 2018-07-01 DIAGNOSIS — O98512 Other viral diseases complicating pregnancy, second trimester: Secondary | ICD-10-CM

## 2018-07-01 DIAGNOSIS — O98519 Other viral diseases complicating pregnancy, unspecified trimester: Principal | ICD-10-CM

## 2018-07-01 MED ORDER — VALACYCLOVIR HCL 500 MG PO TABS: 500.0000 mg | ORAL_TABLET | Freq: Two times a day (BID) | ORAL | 1 refills | Status: DC

## 2018-07-01 NOTE — Progress Notes (Signed)
Televisit ROB - Pt c/o feeling sick after taking Valacyclovir ("shaking and weak")

## 2018-07-01 NOTE — Progress Notes (Signed)
TELEHEALTH VIRTUAL OBSTETRICS VISIT ENCOUNTER NOTE  I connected with Vickie Baldwin on 07/01/18 at  3:20 PM EDT by telephone at home and verified that I am speaking with the correct person using two identifiers.   I discussed the limitations, risks, security and privacy concerns of performing an evaluation and management service by telephone and the availability of in person appointments. I also discussed with the patient that there may be a patient responsible charge related to this service. The patient expressed understanding and agreed to proceed.  Subjective:  Vickie Baldwin is a 38 y.o. G3P2002 at 1527w2d being followed for ongoing prenatal care.  She is currently monitored for the following issues for this low-risk pregnancy and has Supervision of other normal pregnancy, antepartum; Previous cesarean delivery affecting pregnancy, antepartum; AMA (advanced maternal age) multigravida 35+; Obesity affecting pregnancy; and HSV (herpes simplex virus) anogenital infection on their problem list.  Patient reports no complaints. Reports fetal movement. Denies any contractions, bleeding or leaking of fluid.   The following portions of the patient's history were reviewed and updated as appropriate: allergies, current medications, past family history, past medical history, past social history, past surgical history and problem list.   Objective:   General:  Alert, oriented and cooperative.   Mental Status: Normal mood and affect perceived. Normal judgment and thought content.  Rest of physical exam deferred due to type of encounter  Assessment and Plan:  Pregnancy: G3P2002 at 6227w2d 1. Supervision of other normal pregnancy, antepartum --Pt reports good fetal movement, denies cramping, LOF, or frequent cramping --Reviewed pt BP taken at home: 4/8 103/72 pulse 100 4/13  103/73  pulse 96 4/20  98/69  pulse 111 4/27 104/68  pulse 81 --Anticipatory guidance about next visits/weeks of pregnancy given.  --Answered pt questions about fasting during Ramadan.  Fasting is Ok during pregnancy if she is tolerating well.  Encouraged pt to stay hydrated during the hours she can eat/drink. --Reviewed safety, visitor policy, reassurance about COVID-19 for pregnancy at this time. Discussed possible changes to visits, including televisits, that may occur due to COVID-19.  The office remains open if pt needs to be seen and MAU is open 24 hours/day for OB emergencies.   --Return in 2 weeks for GTT   2. Herpes simplex type 2 (HSV-2) infection affecting pregnancy, antepartum --Pt had trouble completing 1000 mg BID because the morning dose made her feel sick.  She took evening dose daily and her vaginal symptoms have resolved. --Pt to start prophylactic dose at 34 weeks, Rx sent. --Discussed risks of transmission with pt, lower since this is likely a recurrent infection, but need for prophylaxis at 34 weeks.  If outbreak occurs near delivery, she will need C/S. Pt states understanding. - valACYclovir (VALTREX) 500 MG tablet; Take 1 tablet (500 mg total) by mouth 2 (two) times daily. Start medication at 34 weeks of pregnancy  Dispense: 60 tablet; Refill: 1   Preterm labor symptoms and general obstetric precautions including but not limited to vaginal bleeding, contractions, leaking of fluid and fetal movement were reviewed in detail with the patient.  I discussed the assessment and treatment plan with the patient. The patient was provided an opportunity to ask questions and all were answered. The patient agreed with the plan and demonstrated an understanding of the instructions. The patient was advised to call back or seek an in-person office evaluation/go to [facility] for any urgent or concerning symptoms. Please refer to After Visit Summary for  other counseling recommendations.   I provided 20 minutes of non-face-to-face time during this encounter.  No follow-ups on file.  Future  Appointments  Date Time Provider Department Center  08/06/2018  1:30 PM [facility] [place] 5 WH-M[facility] M[facility]-US    Sharen Counter, CNM Center for [employer], [facility]

## 2018-07-10 ENCOUNTER — Inpatient Hospital Stay (HOSPITAL_BASED_OUTPATIENT_CLINIC_OR_DEPARTMENT_OTHER): Payer: BLUE CROSS/BLUE SHIELD

## 2018-07-10 ENCOUNTER — Inpatient Hospital Stay (HOSPITAL_COMMUNITY): Payer: BLUE CROSS/BLUE SHIELD

## 2018-07-10 ENCOUNTER — Encounter (HOSPITAL_COMMUNITY): Payer: Self-pay

## 2018-07-10 ENCOUNTER — Inpatient Hospital Stay (HOSPITAL_COMMUNITY)
Admission: AD | Admit: 2018-07-10 | Discharge: 2018-08-03 | DRG: 786 | Disposition: A | Discharge status: Home / Self Care | Payer: BLUE CROSS/BLUE SHIELD | Attending: Obstetrics and Gynecology | Admitting: Obstetrics and Gynecology

## 2018-07-10 ENCOUNTER — Other Ambulatory Visit: Payer: Self-pay

## 2018-07-10 DIAGNOSIS — O34219 Maternal care for unspecified type scar from previous cesarean delivery: Secondary | ICD-10-CM | POA: Diagnosis not present

## 2018-07-10 DIAGNOSIS — Z362 Encounter for other antenatal screening follow-up: Secondary | ICD-10-CM

## 2018-07-10 DIAGNOSIS — Z3A31 31 weeks gestation of pregnancy: Secondary | ICD-10-CM | POA: Diagnosis not present

## 2018-07-10 DIAGNOSIS — O99213 Obesity complicating pregnancy, third trimester: Secondary | ICD-10-CM

## 2018-07-10 DIAGNOSIS — Z3A30 30 weeks gestation of pregnancy: Secondary | ICD-10-CM | POA: Diagnosis not present

## 2018-07-10 DIAGNOSIS — O3413 Maternal care for benign tumor of corpus uteri, third trimester: Secondary | ICD-10-CM | POA: Diagnosis not present

## 2018-07-10 DIAGNOSIS — Z3A38 38 weeks gestation of pregnancy: Secondary | ICD-10-CM | POA: Diagnosis not present

## 2018-07-10 DIAGNOSIS — E669 Obesity, unspecified: Secondary | ICD-10-CM | POA: Diagnosis present

## 2018-07-10 DIAGNOSIS — O09523 Supervision of elderly multigravida, third trimester: Secondary | ICD-10-CM | POA: Diagnosis not present

## 2018-07-10 DIAGNOSIS — O4593 Premature separation of placenta, unspecified, third trimester: Secondary | ICD-10-CM | POA: Diagnosis present

## 2018-07-10 DIAGNOSIS — Z3A27 27 weeks gestation of pregnancy: Secondary | ICD-10-CM

## 2018-07-10 DIAGNOSIS — O42112 Preterm premature rupture of membranes, onset of labor more than 24 hours following rupture, second trimester: Principal | ICD-10-CM | POA: Diagnosis present

## 2018-07-10 DIAGNOSIS — O4290 Premature rupture of membranes, unspecified as to length of time between rupture and onset of labor, unspecified weeks of gestation: Secondary | ICD-10-CM

## 2018-07-10 DIAGNOSIS — A609 Anogenital herpesviral infection, unspecified: Secondary | ICD-10-CM

## 2018-07-10 DIAGNOSIS — Z3A28 28 weeks gestation of pregnancy: Secondary | ICD-10-CM

## 2018-07-10 DIAGNOSIS — O34211 Maternal care for low transverse scar from previous cesarean delivery: Secondary | ICD-10-CM | POA: Diagnosis present

## 2018-07-10 DIAGNOSIS — O42113 Preterm premature rupture of membranes, onset of labor more than 24 hours following rupture, third trimester: Secondary | ICD-10-CM | POA: Diagnosis not present

## 2018-07-10 DIAGNOSIS — O321XX0 Maternal care for breech presentation, not applicable or unspecified: Secondary | ICD-10-CM | POA: Diagnosis present

## 2018-07-10 DIAGNOSIS — O99214 Obesity complicating childbirth: Secondary | ICD-10-CM | POA: Diagnosis present

## 2018-07-10 DIAGNOSIS — O42111 Preterm premature rupture of membranes, onset of labor more than 24 hours following rupture, first trimester: Secondary | ICD-10-CM

## 2018-07-10 DIAGNOSIS — Z1159 Encounter for screening for other viral diseases: Secondary | ICD-10-CM | POA: Diagnosis not present

## 2018-07-10 DIAGNOSIS — D649 Anemia, unspecified: Secondary | ICD-10-CM | POA: Diagnosis present

## 2018-07-10 DIAGNOSIS — O9832 Other infections with a predominantly sexual mode of transmission complicating childbirth: Secondary | ICD-10-CM | POA: Diagnosis present

## 2018-07-10 DIAGNOSIS — O4690 Antepartum hemorrhage, unspecified, unspecified trimester: Secondary | ICD-10-CM

## 2018-07-10 DIAGNOSIS — Z302 Encounter for sterilization: Secondary | ICD-10-CM | POA: Diagnosis not present

## 2018-07-10 DIAGNOSIS — O328XX0 Maternal care for other malpresentation of fetus, not applicable or unspecified: Secondary | ICD-10-CM | POA: Diagnosis present

## 2018-07-10 DIAGNOSIS — O42913 Preterm premature rupture of membranes, unspecified as to length of time between rupture and onset of labor, third trimester: Secondary | ICD-10-CM | POA: Diagnosis not present

## 2018-07-10 DIAGNOSIS — O4693 Antepartum hemorrhage, unspecified, third trimester: Secondary | ICD-10-CM | POA: Diagnosis not present

## 2018-07-10 DIAGNOSIS — O42919 Preterm premature rupture of membranes, unspecified as to length of time between rupture and onset of labor, unspecified trimester: Secondary | ICD-10-CM

## 2018-07-10 DIAGNOSIS — O99210 Obesity complicating pregnancy, unspecified trimester: Secondary | ICD-10-CM | POA: Diagnosis present

## 2018-07-10 DIAGNOSIS — O9902 Anemia complicating childbirth: Secondary | ICD-10-CM | POA: Diagnosis present

## 2018-07-10 DIAGNOSIS — O42912 Preterm premature rupture of membranes, unspecified as to length of time between rupture and onset of labor, second trimester: Secondary | ICD-10-CM | POA: Diagnosis present

## 2018-07-10 DIAGNOSIS — Z364 Encounter for antenatal screening for fetal growth retardation: Secondary | ICD-10-CM | POA: Diagnosis not present

## 2018-07-10 DIAGNOSIS — O09529 Supervision of elderly multigravida, unspecified trimester: Secondary | ICD-10-CM

## 2018-07-10 DIAGNOSIS — A6000 Herpesviral infection of urogenital system, unspecified: Secondary | ICD-10-CM | POA: Diagnosis present

## 2018-07-10 DIAGNOSIS — O0990 Supervision of high risk pregnancy, unspecified, unspecified trimester: Secondary | ICD-10-CM

## 2018-07-10 DIAGNOSIS — Z98891 History of uterine scar from previous surgery: Secondary | ICD-10-CM

## 2018-07-10 DIAGNOSIS — Z3A29 29 weeks gestation of pregnancy: Secondary | ICD-10-CM | POA: Diagnosis not present

## 2018-07-10 LAB — CBC
HCT: 32.2 % — ABNORMAL LOW (ref 36.0–46.0)
Hemoglobin: 9.9 g/dL — ABNORMAL LOW (ref 12.0–15.0)
MCH: 23.6 pg — ABNORMAL LOW (ref 26.0–34.0)
MCHC: 30.7 g/dL (ref 30.0–36.0)
MCV: 76.8 fL — ABNORMAL LOW (ref 80.0–100.0)
Platelets: 173 10*3/uL (ref 150–400)
RBC: 4.19 MIL/uL (ref 3.87–5.11)
RDW: 18.6 % — ABNORMAL HIGH (ref 11.5–15.5)
WBC: 7.9 10*3/uL (ref 4.0–10.5)
nRBC: 0 % (ref 0.0–0.2)

## 2018-07-10 LAB — URINALYSIS, ROUTINE W REFLEX MICROSCOPIC
Bilirubin Urine: NEGATIVE
Glucose, UA: NEGATIVE mg/dL
Hgb urine dipstick: NEGATIVE
Ketones, ur: NEGATIVE mg/dL
Leukocytes,Ua: NEGATIVE
Nitrite: NEGATIVE
Protein, ur: NEGATIVE mg/dL
Specific Gravity, Urine: 1.013 (ref 1.005–1.030)
pH: 6 (ref 5.0–8.0)

## 2018-07-10 LAB — TYPE AND SCREEN
ABO/RH(D): O POS
Antibody Screen: NEGATIVE

## 2018-07-10 LAB — WET PREP, GENITAL
Clue Cells Wet Prep HPF POC: NONE SEEN
Sperm: NONE SEEN
Trich, Wet Prep: NONE SEEN
Yeast Wet Prep HPF POC: NONE SEEN

## 2018-07-10 LAB — AMNISURE RUPTURE OF MEMBRANE (ROM) NOT AT ARMC: Amnisure ROM: POSITIVE

## 2018-07-10 LAB — ABO/RH: ABO/RH(D): O POS

## 2018-07-10 MED ORDER — BETAMETHASONE SOD PHOS & ACET 6 (3-3) MG/ML IJ SUSP
12.0000 mg | INTRAMUSCULAR | Status: AC
  Administered 2018-07-10 – 2018-07-11 (×2): 12 mg via INTRAMUSCULAR
  Filled 2018-07-10 (×3): qty 2

## 2018-07-10 MED ORDER — CALCIUM CARBONATE ANTACID 500 MG PO CHEW
2.0000 | CHEWABLE_TABLET | ORAL | Status: DC | PRN
  Administered 2018-07-29: 400 mg via ORAL
  Filled 2018-07-10 (×2): qty 2

## 2018-07-10 MED ORDER — PRENATAL MULTIVITAMIN CH
1.0000 | ORAL_TABLET | Freq: Every day | ORAL | Status: DC
  Administered 2018-07-10 – 2018-07-31 (×22): 1 via ORAL
  Filled 2018-07-10 (×24): qty 1

## 2018-07-10 MED ORDER — AMOXICILLIN 500 MG PO CAPS
500.0000 mg | ORAL_CAPSULE | Freq: Three times a day (TID) | ORAL | Status: AC
  Administered 2018-07-12 – 2018-07-16 (×14): 500 mg via ORAL
  Filled 2018-07-10 (×14): qty 1

## 2018-07-10 MED ORDER — AZITHROMYCIN 250 MG PO TABS
500.0000 mg | ORAL_TABLET | Freq: Every day | ORAL | Status: AC
  Administered 2018-07-10 – 2018-07-16 (×7): 500 mg via ORAL
  Filled 2018-07-10 (×8): qty 2

## 2018-07-10 MED ORDER — FAMOTIDINE 20 MG PO TABS
20.0000 mg | ORAL_TABLET | Freq: Two times a day (BID) | ORAL | Status: DC
  Administered 2018-07-10 – 2018-07-31 (×43): 20 mg via ORAL
  Filled 2018-07-10 (×44): qty 1

## 2018-07-10 MED ORDER — ONDANSETRON 4 MG PO TBDP
4.0000 mg | ORAL_TABLET | Freq: Three times a day (TID) | ORAL | Status: DC | PRN
  Administered 2018-07-29: 4 mg via ORAL
  Filled 2018-07-10: qty 1

## 2018-07-10 MED ORDER — SODIUM CHLORIDE 0.9 % IV SOLN
2.0000 g | Freq: Four times a day (QID) | INTRAVENOUS | Status: AC
  Administered 2018-07-10 – 2018-07-11 (×7): 2 g via INTRAVENOUS
  Filled 2018-07-10 (×7): qty 2000

## 2018-07-10 MED ORDER — DOCUSATE SODIUM 100 MG PO CAPS
100.0000 mg | ORAL_CAPSULE | Freq: Every day | ORAL | Status: DC
  Administered 2018-07-10 – 2018-07-31 (×22): 100 mg via ORAL
  Filled 2018-07-10 (×23): qty 1

## 2018-07-10 MED ORDER — ACETAMINOPHEN 325 MG PO TABS
650.0000 mg | ORAL_TABLET | ORAL | Status: DC | PRN
  Administered 2018-07-11 – 2018-07-31 (×20): 650 mg via ORAL
  Filled 2018-07-10 (×20): qty 2

## 2018-07-10 MED ORDER — ZOLPIDEM TARTRATE 5 MG PO TABS
5.0000 mg | ORAL_TABLET | Freq: Every evening | ORAL | Status: DC | PRN
  Administered 2018-07-15 – 2018-07-17 (×2): 5 mg via ORAL
  Filled 2018-07-10 (×2): qty 1

## 2018-07-10 MED ORDER — VALACYCLOVIR HCL 500 MG PO TABS
500.0000 mg | ORAL_TABLET | Freq: Two times a day (BID) | ORAL | Status: DC
  Administered 2018-07-10 – 2018-07-31 (×43): 500 mg via ORAL
  Filled 2018-07-10 (×43): qty 1

## 2018-07-10 NOTE — H&P (Signed)
Vickie Baldwin is a 38 y.o. female  At [redacted]w[redacted]d per 19 week [place], presenting for leaking of watery discharge from vagina since yesterday.  Has not had any painful contractions.  Has noticed decreased movement today. Marland KitchenHistory of Vaginal birth followed by a C/S delivery for breech.  Was planning a TOLAC but now "is afraid of the pain"   Interpretor used (video)  RN Note: Patient reports to MAU reporting leaking of water since yesterday. Patient reports decrease fetal movement in the pat 4 hours. Patient denies vaginal bleeding. No abd pain. No contractions  Patient Active Problem List   Diagnosis Date Noted  . HSV (herpes simplex virus) anogenital infection 05/29/2018  . Previous cesarean delivery affecting pregnancy, antepartum 03/11/2018  . AMA (advanced maternal age) multigravida 35+ 03/11/2018  . Obesity affecting pregnancy 03/11/2018  . Supervision of other normal pregnancy, antepartum 02/08/2018    OB History    Gravida  3   Para  2   Term  2   Preterm      AB      Living  2     SAB      TAB      Ectopic      Multiple      Live Births  2        Obstetric Comments  C/s for breech       History reviewed. No pertinent past medical history. Past Surgical History:  Procedure Laterality Date  . CESAREAN SECTION    . TONSILLECTOMY     Family History: family history includes Diabetes in her brother and mother; Hypertension in her father. Social History:  reports that she has never smoked. She has never used smokeless tobacco. She reports that she does not drink alcohol or use drugs.     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  Cardiovascular: Negative for leg swelling.  Gastrointestinal: Negative for abdominal pain, constipation, diarrhea, nausea and vomiting.  Genitourinary: Negative for dysuria.       Vaginal discharge, watery   Maternal Medical History:  Reason for admission: Rupture of membranes.  Nausea.  Contractions: Frequency: regular.   Perceived severity is mild.   Patient does not report feeling contractions.  PPROM occurred yesterday  Fetal activity: Perceived fetal activity is decreased.   Last perceived fetal movement was within the past hour.    Prenatal complications: No bleeding, PIH, infection, pre-eclampsia or preterm labor ( PPROM).   Prenatal Complications - Diabetes: none.      Blood pressure 121/70, pulse (!) 103, temperature 98.3 F (36.8 C), temperature source Oral, resp. rate 16, height 5\' 2"  (1.575 m), weight 92.5 kg, last menstrual period 12/25/2017. Maternal Exam:  Uterine Assessment: Contraction strength is mild.  Contraction frequency is rare.   Abdomen: Patient reports no abdominal tenderness. Surgical scars: low transverse.   Fundal height is 27.    Introitus: Normal vulva. Normal vagina.  Ferning test: positive.  Nitrazine test: not done. Amniotic fluid character: clear.  Cervix: Cervix evaluated by sterile speculum exam.   Unable to see cervix on speculum exam  Fetal Exam Fetal Monitor Review: Mode: ultrasound.   Baseline  rate: 140.  Variability: moderate (6-25 bpm).   Pattern: accelerations present and no decelerations.    Fetal State Assessment: Category I - tracings are normal.     Physical Exam  Constitutional: She is oriented to person, place, and time. She appears well-developed and well-nourished. No distress.  HENT:  Head: Normocephalic.  Cardiovascular: Normal rate and regular rhythm.  Respiratory: Effort normal. No respiratory distress.  GI: Soft. She exhibits no distension. There is  no abdominal tenderness. There is no rebound and no guarding.  Genitourinary:    Vulva normal.     Genitourinary Comments: Sterile Speculum exam Clear copious fluid Unable to visualize cervix Digital exam deferred   Musculoskeletal: Normal range of motion.  Neurological: She is alert and oriented to person, place, and time.  Skin: Skin is warm and dry.  Psychiatric: She has a normal mood and affect.    Prenatal labs: ABO, Rh: O/Positive/-- (01/06 1129) Antibody: Negative (01/06 1129) Rubella: 13.20 (01/06 1129) RPR: Non Reactive (01/06 1129)  HBsAg: Negative (01/06 1129)  HIV: Non Reactive (01/06 1129)  GBS:     Assessment/Plan: Single intrauterine pregnancy at 4117w4d PPROM Regular mild contractions but not actively laboring History of cesarean section Had planned TOLAC  Admit to High Risk Ultrasound to examine presentation and fetal status (dated by 19 wk US, ant placenta) Orders per MD   Wynelle BourgeoisMarie Davarius Ridener 07/10/2018, 7:19 AM

## 2018-07-10 NOTE — MAU Note (Signed)
Pt to u/s and then will go to room 102. Technician made aware to let an RN know when she drops the patient off.

## 2018-07-10 NOTE — MAU Note (Addendum)
Patient reports to MAU reporting leaking of water since yesterday. Patient reports decrease fetal movement in the pat 4 hours. Patient denies vaginal bleeding. No abd pain. No contractions.

## 2018-07-11 LAB — URINE CULTURE: Culture: NO GROWTH

## 2018-07-11 LAB — GC/CHLAMYDIA PROBE AMP (~~LOC~~) NOT AT ARMC
Chlamydia: NEGATIVE
Neisseria Gonorrhea: NEGATIVE

## 2018-07-11 NOTE — Progress Notes (Addendum)
NST- 2156-2216 FHT baseline 145, with 10x10 at 2217- 2237 baseline FHT 140 with 15x15. With first half of strip, contracting q2-4 minutes, lasting 30-40 seconds, patient denies feeling any contractions. After position change had some UI.  Denies any leaking of fluid.  No deceleration noted.

## 2018-07-11 NOTE — Progress Notes (Signed)
Patient ID: [name], female   DOB: 07-23-1980, 38 y.o.   MRN: [id number] FACULTY PRACTICE ANTEPARTUM(COMPREHENSIVE) NOTE  Vickie Baldwin is a 38 y.o. [id number] with Estimated Date of Delivery: 10/05/18   By   [redacted]w[redacted]d  who is admitted for PROM.    Fetal presentation is breech. Length of Stay:  1  Days  Date of admission:07/10/2018  Subjective:  Patient reports the fetal movement as active. Patient reports uterine contraction  activity as none. Patient reports  vaginal bleeding as none. Patient describes fluid per vagina as Clear.  Vitals:  Blood pressure 106/63, pulse 82, temperature 98.4 F (36.9 C), temperature source Oral, resp. rate 16, height 5\' 2"  (1.575 m), weight 92.5 kg, last menstrual period 12/25/2017, SpO2 100 %. Vitals:   07/10/18 1919 07/10/18 1920 07/10/18 2345 07/11/18 0401  BP: 110/64  107/62 106/63  Pulse: 88  88 82  Resp: 16  17 16   Temp: 98.5 F (36.9 C)  98 F (36.7 C) 98.4 F (36.9 C)  TempSrc: Oral  Oral Oral  SpO2: 98% 98% 100% 100%  Weight:      Height:       Physical Examination:  General appearance - alert, well appearing, and in no distress Abdomen - soft, nontender, nondistended, no masses or organomegaly Fundal Height:  size equals dates Pelvic Exam:  examination not indicated Cervical Exam: Not evaluated.  Extremities: extremities normal, atraumatic, no cyanosis or edema with DTRs  Membranes:ruptured, clear fluid  Fetal Monitoring:  Baseline: 140 bpm, Variability: Good {> 6 bpm), Accelerations: Reactive and Decelerations: Absent   reactive  Labs:  Results for orders placed or performed during the hospital encounter of 07/10/18 (from the past 24 hour(s))  Amnisure rupture of membrane (rom)not at [facility]   Collection Time: 07/10/18 10:18 AM  Result Value Ref Range   Amnisure ROM POSITIVE     Imaging Studies:    See report from yesterday  Medications:  Scheduled . [START ON 07/12/2018] amoxicillin  500 mg Oral Q8H  . azithromycin  500 mg Oral Daily   . docusate sodium  100 mg Oral Daily  . famotidine  20 mg Oral BID  . prenatal multivitamin  1 tablet Oral Q1200  . valACYclovir  500 mg Oral BID   I have reviewed the patient's current medications.  ASSESSMENT: [id number] [redacted]w[redacted]d Estimated Date of Delivery: 10/05/18  Patient Active Problem List   Diagnosis Date Noted  . PROM (premature rupture of membranes) 07/10/2018  . HSV (herpes simplex virus) anogenital infection 05/29/2018  . Previous cesarean delivery affecting pregnancy, antepartum 03/11/2018  . AMA (advanced maternal age) multigravida 35+ 03/11/2018  . Obesity affecting pregnancy 03/11/2018  . Supervision of other normal pregnancy, antepartum 02/08/2018    PLAN: >continue latency antibiotics >Continue in house management >S/P betamethasone >Repeat C section as this baby is also breech  Vickie Baldwin 07/11/2018,7:48 AM

## 2018-07-12 LAB — CULTURE, BETA STREP (GROUP B ONLY)

## 2018-07-12 MED ORDER — SODIUM CHLORIDE 0.9% FLUSH
3.0000 mL | Freq: Two times a day (BID) | INTRAVENOUS | Status: DC
  Administered 2018-07-12 – 2018-07-31 (×34): 3 mL via INTRAVENOUS

## 2018-07-12 NOTE — Progress Notes (Signed)
Patient ID: Vickie Baldwin, female   DOB: 04/10/1980, 38 y.o.   MRN: [id number] FACULTY PRACTICE ANTEPARTUM(COMPREHENSIVE) NOTE  Yatzary Egeler is a 38 y.o. G3P2002 at [redacted]w[redacted]d by best clinical estimate who is admitted for PROM.   Fetal presentation is breech. Length of Stay:  2  Days  Subjective: Feels well. Interpreter used via video Patient reports the fetal movement as active. Patient reports uterine contraction  activity as none. Patient reports  vaginal bleeding as none. Patient describes fluid per vagina as Clear.  Vitals:  Blood pressure (!) 105/56, pulse 74, temperature 97.9 F (36.6 C), temperature source Oral, resp. rate 17, height 5\' 2"  (1.575 m), weight 92.5 kg, last menstrual period 12/25/2017, SpO2 100 %. Physical Examination:  General appearance - alert, well appearing, and in no distress Heart - normal rate and regular rhythm Abdomen - soft, nontender, nondistended Fundal Height:  size equals dates Cervical Exam: Not evaluated. Extremities: extremities normal, atraumatic, no cyanosis or edema and Homans sign is negative, no sign of DVT Membranes:ruptured  Fetal Monitoring:  Baseline: 150 bpm, Variability: Good {> 6 bpm), Accelerations: Reactive and Decelerations: Absent  Labs:  No results found for this or any previous visit (from the past 24 hour(s)).   Medications:  Scheduled . amoxicillin  500 mg Oral Q8H  . azithromycin  500 mg Oral Daily  . docusate sodium  100 mg Oral Daily  . famotidine  20 mg Oral BID  . prenatal multivitamin  1 tablet Oral Q1200  . valACYclovir  500 mg Oral BID   I have reviewed the patient's current medications.  ASSESSMENT: Patient Active Problem List   Diagnosis Date Noted  . PROM (premature rupture of membranes) 07/10/2018  . HSV (herpes simplex virus) anogenital infection 05/29/2018  . Previous cesarean delivery affecting pregnancy, antepartum 03/11/2018  . AMA (advanced maternal age) multigravida 35+ 03/11/2018  . Obesity  affecting pregnancy 03/11/2018  . Supervision of other normal pregnancy, antepartum 02/08/2018    PLAN: Continue antibiotics. Follow for s/sx PTL, infection, fetal intolerance  Scheryl Darter 07/12/2018,9:36 AM

## 2018-07-13 DIAGNOSIS — O42113 Preterm premature rupture of membranes, onset of labor more than 24 hours following rupture, third trimester: Secondary | ICD-10-CM

## 2018-07-13 DIAGNOSIS — Z3A28 28 weeks gestation of pregnancy: Secondary | ICD-10-CM

## 2018-07-13 LAB — TYPE AND SCREEN
ABO/RH(D): O POS
Antibody Screen: NEGATIVE

## 2018-07-13 NOTE — Progress Notes (Signed)
Patient ID: Vickie Baldwin, female   DOB: 27-Jan-1981, 38 y.o.   MRN: [id number] FACULTY PRACTICE ANTEPARTUM(COMPREHENSIVE) NOTE  Vickie Baldwin is a 38 y.o. G3P2002 at [redacted]w[redacted]d by best clinical estimate who is admitted for PROM.   Fetal presentation is breech. Length of Stay:  3  Days  Subjective: Feels well. No complaints. Patient reports the fetal movement as active. Patient reports uterine contraction  activity as none. Patient reports  vaginal bleeding as none. Patient describes fluid per vagina as Meconium.  Vitals:  Blood pressure (!) 121/59, pulse 83, temperature 98.3 F (36.8 C), temperature source Oral, resp. rate 17, height 5\' 2"  (1.575 m), weight 92.5 kg, last menstrual period 12/25/2017, SpO2 100 %. Physical Examination:  General appearance - alert, well appearing, and in no distress Chest - normal effort Abdomen - gravid, non-tender Fundal Height:  size equals dates Extremities: Homans sign is negative, no sign of DVT  Membranes:ruptured, clear fluid  Fetal Monitoring:  Baseline: 150 bpm, Variability: Good {> 6 bpm), Accelerations: Reactive and Decelerations: Absent   Medications:  Scheduled . amoxicillin  500 mg Oral Q8H  . azithromycin  500 mg Oral Daily  . docusate sodium  100 mg Oral Daily  . famotidine  20 mg Oral BID  . prenatal multivitamin  1 tablet Oral Q1200  . sodium chloride flush  3 mL Intravenous Q12H  . valACYclovir  500 mg Oral BID   I have reviewed the patient's current medications.  ASSESSMENT: Active Problems:   PROM (premature rupture of membranes)   PLAN: Continue antepartum care Latency antibiotics Delivery with s/sx's of infection Previous C-section and breech would need repeat C-section for delivery  [name], MD 07/13/2018,8:01 AM

## 2018-07-14 NOTE — Progress Notes (Signed)
Patient ID: Vickie Baldwin, female   DOB: 1980/10/24, 38 y.o.   MRN: [id number] FACULTY PRACTICE ANTEPARTUM(COMPREHENSIVE) NOTE  Vickie Baldwin is a 38 y.o. G3P2002 at [redacted]w[redacted]d by best clinical estimate who is admitted for PROM.   Fetal presentation is breech. Length of Stay:  4  Days  Subjective: Feels well, spoke via interpreter Patient reports the fetal movement as active. Patient reports uterine contraction  activity as none. Patient reports  vaginal bleeding as none. Patient describes fluid per vagina as Clear.  Vitals:  Blood pressure 102/66, pulse 91, temperature 98.1 F (36.7 C), temperature source Oral, resp. rate 16, height 5\' 2"  (1.575 m), weight 92.5 kg, last menstrual period 12/25/2017, SpO2 100 %. Physical Examination:  General appearance - alert, well appearing, and in no distress Heart - normal rate and regular rhythm Abdomen - soft, nontender, nondistended Fundal Height:  size equals dates Cervical Exam: Not evaluated.  Extremities: extremities normal, atraumatic, no cyanosis or edema and Homans sign is negative, no sign of DVT with DTRs 2+ bilaterally Membranes:ruptured, clear fluid  Fetal Monitoring:  Baseline: 150 bpm, Variability: Good {> 6 bpm), Accelerations: Reactive and Decelerations: Absent  Labs:  No results found for this or any previous visit (from the past 24 hour(s)).   Medications:  Scheduled . amoxicillin  500 mg Oral Q8H  . azithromycin  500 mg Oral Daily  . docusate sodium  100 mg Oral Daily  . famotidine  20 mg Oral BID  . prenatal multivitamin  1 tablet Oral Q1200  . sodium chloride flush  3 mL Intravenous Q12H  . valACYclovir  500 mg Oral BID   I have reviewed the patient's current medications.  ASSESSMENT: Patient Active Problem List   Diagnosis Date Noted  . PROM (premature rupture of membranes) 07/10/2018  . HSV (herpes simplex virus) anogenital infection 05/29/2018  . Previous cesarean delivery affecting pregnancy, antepartum 03/11/2018   . AMA (advanced maternal age) multigravida 35+ 03/11/2018  . Obesity affecting pregnancy 03/11/2018  . Supervision of other normal pregnancy, antepartum 02/08/2018    PLAN: Continue ;hospitalization for PPROM, latency antibiotics  Scheryl Darter 07/14/2018,7:55 AM

## 2018-07-15 ENCOUNTER — Encounter: Payer: BLUE CROSS/BLUE SHIELD | Admitting: Advanced Practice Midwife

## 2018-07-15 ENCOUNTER — Other Ambulatory Visit: Payer: BLUE CROSS/BLUE SHIELD

## 2018-07-15 DIAGNOSIS — O321XX0 Maternal care for breech presentation, not applicable or unspecified: Secondary | ICD-10-CM | POA: Diagnosis present

## 2018-07-15 DIAGNOSIS — O42112 Preterm premature rupture of membranes, onset of labor more than 24 hours following rupture, second trimester: Principal | ICD-10-CM

## 2018-07-15 MED ORDER — ONDANSETRON HCL 4 MG PO TABS: 4.0000 mg | ORAL_TABLET | Freq: Three times a day (TID) | ORAL | Status: DC | PRN

## 2018-07-15 NOTE — Consult Note (Signed)
[facility]  [facility] 07/15/2018    10:53 PM  Neonatal Medicine Consultation         Harlan Bonafede          MRN:  [id number]  I was called at the request of the patient's obstetrician (Dr. Earlene Plater) to speak to this patient due to potential premature delivery at 28+ weeks due to premature ROM.  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  She is 28 2/7 weeks currently.  She is admitted to [facility] unit, and is receiving treatment that includes BMZ (given on 5/6 and 5/7), latency antibiotics, Valtrex.  The baby is a female.  I reviewed expectations for a baby born at 28+ weeks, including survival, length of stay, issues such as respiratory distress, IVH, infection, feeding.  I described how we provide respiratory and feeding support.  Mom plans to breast feed, which I encouraged as best for the baby (with supplementations for needed calories).  I let mom know that the baby's outlook generally improves the longer she remains undelivered.  I spent 30 minutes reviewing the record, speaking to the patient, and entering appropriate documentation.  More than 50% of the time was spent face to face with patient.   _____________________ Electronically Signed By: Angelita Ingles, MD Neonatologist

## 2018-07-15 NOTE — Progress Notes (Signed)
FACULTY PRACTICE ANTEPARTUM PROGRESS NOTE  Vickie Baldwin is a 38 y.o. G3P2002 at [redacted]w[redacted]d who is admitted for PROM.  Estimated Date of Delivery: 10/05/18 Fetal presentation is breech.  Length of Stay:  5 Days. Admitted 07/10/2018  Subjective:  Patient reports normal fetal movement.  She denies uterine contractions, denies bleeding and leaking of fluid per vagina.  Vitals:  Blood pressure 123/63, pulse 78, temperature 98.6 F (37 C), temperature source Oral, resp. rate 16, height 5\' 2"  (1.575 m), weight 92.5 kg, last menstrual period 12/25/2017, SpO2 99 %. Physical Examination: CONSTITUTIONAL: Well-developed, well-nourished female in no acute distress.  HENT:  Normocephalic, atraumatic, External right and left ear normal. Oropharynx is clear and moist EYES: Conjunctivae and EOM are normal. Pupils are equal, round, and reactive to light. No scleral icterus.  NECK: Normal range of motion, supple, no masses. SKIN: Skin is warm and dry. No rash noted. Not diaphoretic. No erythema. No pallor. NEUROLGIC: Alert and oriented to person, place, and time. Normal reflexes, muscle tone coordination. No cranial nerve deficit noted. PSYCHIATRIC: Normal mood and affect. Normal behavior. Normal judgment and thought content. CARDIOVASCULAR: Normal heart rate noted, regular rhythm RESPIRATORY: Effort and breath sounds normal, no problems with respiration noted MUSCULOSKELETAL: Normal range of motion. No edema and no tenderness. ABDOMEN: Soft, nontender, nondistended, gravid. CERVIX: deferred  Fetal monitoring: FHR: 150 bpm, Variability: moderate, Accelerations: Present, Decelerations: Absent  Uterine activity: no contractions per hour  No results found for this or any previous visit (from the past 48 hour(s)).  I have reviewed the patient's current medications.  ASSESSMENT: Active Problems:   Previous cesarean delivery affecting pregnancy, antepartum   AMA (advanced maternal age) multigravida 35+   Obesity  affecting pregnancy   HSV (herpes simplex virus) anogenital infection   PROM (premature rupture of membranes)   Breech presentation   PLAN: - Reviewed plan for delivery, reviewed if fetus turns cephalic, would consider TOLAC. Reviewed risks/benefits, she is undecided, reviewed that if baby is non-cephalic, would recommend c-section for delivery, patient verbalizes understanding, wants to do whatever we think is best for baby - no s/s infection - cont latency abx - NICU consult - SCDs - regular diet - cont valtrex - s/p BTMZ 5/6-7  Interview done via Print production planner. Continue routine antenatal care.   Baldemar Lenis, M.D. Attending [facility] for [employer] (Faculty Practice)  07/15/2018 11:51 AM

## 2018-07-16 LAB — TYPE AND SCREEN
ABO/RH(D): O POS
Antibody Screen: NEGATIVE

## 2018-07-16 NOTE — Progress Notes (Signed)
FACULTY PRACTICE ANTEPARTUM PROGRESS NOTE  Vickie Baldwin is a 38 y.o. G3P2002 at [redacted]w[redacted]d who is admitted for PROM.  Estimated Date of Delivery: 10/05/18 Fetal presentation is breech.  Length of Stay:  6 Days. Admitted 07/10/2018  Subjective: Patient reports normal fetal movement.  She denies uterine contractions, denies bleeding and leaking of fluid per vagina. Had several questions about stay, overall is feeling well.  Vitals:  Blood pressure 110/62, pulse 87, temperature 97.7 F (36.5 C), temperature source Oral, resp. rate 18, height 5\' 2"  (1.575 m), weight 92.5 kg, last menstrual period 12/25/2017, SpO2 100 %. Physical Examination: CONSTITUTIONAL: Well-developed, well-nourished female in no acute distress.  HENT:  Normocephalic, atraumatic, External right and left ear normal. Oropharynx is clear and moist EYES: Conjunctivae and EOM are normal. Pupils are equal, round, and reactive to light. No scleral icterus.  NECK: Normal range of motion, supple, no masses. SKIN: Skin is warm and dry. No rash noted. Not diaphoretic. No erythema. No pallor. NEUROLGIC: Alert and oriented to person, place, and time. Normal reflexes, muscle tone coordination. No cranial nerve deficit noted. PSYCHIATRIC: Normal mood and affect. Normal behavior. Normal judgment and thought content. CARDIOVASCULAR: Normal heart rate noted RESPIRATORY: Effort normal, no problems with respiration noted MUSCULOSKELETAL: Normal range of motion. No edema and no tenderness. ABDOMEN: Soft, nontender, nondistended, gravid. CERVIX: deferred  Fetal monitoring: FHR: 150 bpm, Variability: moderate, Accelerations: Present, Decelerations: Absent  Uterine activity: no contractions per hour  Results for orders placed or performed during the hospital encounter of 07/10/18 (from the past 48 hour(s))  Type and screen [facility]     Status: None   Collection Time: 07/16/18  7:38 AM  Result Value Ref Range   ABO/RH(D) O POS    Antibody Screen NEG    Sample Expiration      07/19/2018,2359 Performed at [facility], [address] [place], [place] [address]     I have reviewed the patient's current medications.  ASSESSMENT: Active Problems:   Previous cesarean delivery affecting pregnancy, antepartum   AMA (advanced maternal age) multigravida 35+   Obesity affecting pregnancy   HSV (herpes simplex virus) anogenital infection   PROM (premature rupture of membranes)   Breech presentation   PLAN: - c-section if fetus remains breech - no s/s infection - s/p NICU consult - cont valtrex, had extended conversation about this diagnosis today, reviewed risks/benefits, she is agreeable to continuing valtrex - s/p BTMZ 5/6-7 - cont latency abx (day 7/7)  Interview done via Print production planner.  Continue routine antenatal care.   Baldemar Lenis, M.D. Attending [facility] [employer] (Faculty Practice)  07/16/2018 11:18 AM

## 2018-07-17 NOTE — Progress Notes (Signed)
FACULTY PRACTICE ANTEPARTUM PROGRESS NOTE  Vickie Baldwin is a 38 y.o. G3P2002 at [redacted]w[redacted]d who is admitted for PROM.  Estimated Date of Delivery: 10/05/18 Fetal presentation is breech.  Length of Stay:  7 Days. Admitted 07/10/2018  Subjective: Patient reports normal fetal movement.  She denies uterine contractions, denies bleeding and leaking of fluid per vagina.  Vitals:  Blood pressure 128/68, pulse 91, temperature 98.6 F (37 C), temperature source Oral, resp. rate 16, height 5\' 2"  (1.575 m), weight 92.5 kg, last menstrual period 12/25/2017, SpO2 100 %. Physical Examination: CONSTITUTIONAL: Well-developed, well-nourished female in no acute distress.  HENT:  Normocephalic, atraumatic, External right and left ear normal. Oropharynx is clear and moist EYES: Conjunctivae and EOM are normal. Pupils are equal, round, and reactive to light. No scleral icterus.  NECK: Normal range of motion, supple, no masses. SKIN: Skin is warm and dry. No rash noted. Not diaphoretic. No erythema. No pallor. NEUROLGIC: Alert and oriented to person, place, and time. Normal reflexes, muscle tone coordination. No cranial nerve deficit noted. PSYCHIATRIC: Normal mood and affect. Normal behavior. Normal judgment and thought content. CARDIOVASCULAR: Normal heart rate noted RESPIRATORY: Effort normal, no problems with respiration noted MUSCULOSKELETAL: Normal range of motion. No edema and no tenderness. ABDOMEN: Soft, nontender, nondistended, gravid. CERVIX: deferred  Fetal monitoring: FHR: 150 bpm, Variability: moderate, Accelerations: Present, Decelerations: Absent  Uterine activity: no contractions per hour  Results for orders placed or performed during the hospital encounter of 07/10/18 (from the past 48 hour(s))  Type and screen [facility]     Status: None   Collection Time: 07/16/18  7:38 AM  Result Value Ref Range   ABO/RH(D) O POS    Antibody Screen NEG    Sample Expiration     07/19/2018,2359 Performed at [facility], [address], [place], [place] [address]     I have reviewed the patient's current medications.  ASSESSMENT: Active Problems:   Previous cesarean delivery affecting pregnancy, antepartum   AMA (advanced maternal age) multigravida 35+   Obesity affecting pregnancy   HSV (herpes simplex virus) anogenital infection   PROM (premature rupture of membranes)   Breech presentation   PLAN: - c-section if fetus remains breech - no s/s infection - s/p NICU consult - cont valtrex - s/p BTMZ 5/6-7 - s/p latency abx   Continue routine antenatal care.   Baldemar Lenis, M.D. Attending [facility] [employer] (Faculty Practice)  07/17/2018 10:05 AM

## 2018-07-17 NOTE — Progress Notes (Signed)
Initial Nutrition Assessment  DOCUMENTATION CODES:  Obesity unspecified  INTERVENTION:  Regular Diet Snacks TID and double protein portions upon request  NUTRITION DIAGNOSIS:  Increased nutrient needs related to (pregnancy and fetal growth requirements) as evidenced by (28 weeks IUP).  GOAL:  Patient will meet greater than or equal to 90% of their needs  MONITOR:  Weight trends  REASON FOR ASSESSMENT:  Antenatal   ASSESSMENT:  28 4/7 weeks, adm with PROM. Regular diet.  Wt at 10 weeks 195 lbs, BMI 35.8. Wt gain 7 lbs.  Diet Order:   Diet Order            Diet regular Room service appropriate? Yes; Fluid consistency: Thin  Diet effective now             EDUCATION NEEDS:  No education needs have been identified at this time  Skin:  Skin Assessment: Reviewed RN Assessment Height:   Ht Readings from Last 1 Encounters:  07/10/18 5\' 2"  (1.575 m)   Weight:   Wt Readings from Last 1 Encounters:  07/10/18 92.5 kg    Ideal Body Weight:   110 lbs  BMI:  Body mass index is 37.31 kg/m.  Estimated Nutritional Needs:   Kcal:  2000-2200  Protein:  90-100 g  Fluid:  2.3 L    Elisabeth Cara M.Odis Luster LDN Neonatal Nutrition Support Specialist/RD III Pager [phone]      Phone [phone]

## 2018-07-18 ENCOUNTER — Telehealth: Payer: Self-pay

## 2018-07-18 MED ORDER — DIPHENHYDRAMINE HCL 25 MG PO CAPS
25.0000 mg | ORAL_CAPSULE | Freq: Every evening | ORAL | Status: DC | PRN
  Administered 2018-07-18 – 2018-07-30 (×9): 25 mg via ORAL
  Filled 2018-07-18 (×9): qty 1

## 2018-07-18 MED ORDER — ENOXAPARIN SODIUM 40 MG/0.4ML ~~LOC~~ SOLN
40.0000 mg | SUBCUTANEOUS | Status: DC
  Administered 2018-07-18 – 2018-07-29 (×12): 40 mg via SUBCUTANEOUS
  Filled 2018-07-18 (×12): qty 0.4

## 2018-07-18 MED ORDER — SIMETHICONE 80 MG PO CHEW: 80.0000 mg | CHEWABLE_TABLET | Freq: Four times a day (QID) | ORAL | Status: DC | PRN

## 2018-07-18 NOTE — Telephone Encounter (Signed)
S/w spouse to confirm FMLA dates.

## 2018-07-18 NOTE — Progress Notes (Signed)
FACULTY PRACTICE ANTEPARTUM PROGRESS NOTE  Vickie Baldwin is a 38 y.o. G3P2002 at [redacted]w[redacted]d who is admitted for PROM.  Estimated Date of Delivery: 10/05/18 Fetal presentation is breech.  Length of Stay:  8 Days. Admitted 07/10/2018  Subjective: Patient reports normal fetal movement.  She denies uterine contractions, denies leaking of fluid per vagina. Had a small spot of light pink bleeding in pad this am. Is not sleeping well, reports ambien did not help.  Vitals:  Blood pressure 110/68, pulse (!) 105, temperature 97.8 F (36.6 C), temperature source Oral, resp. rate 16, height 5\' 2"  (1.575 m), weight 92.5 kg, last menstrual period 12/25/2017, SpO2 99 %. Physical Examination: CONSTITUTIONAL: Well-developed, well-nourished female in no acute distress.  HENT:  Normocephalic, atraumatic, External right and left ear normal. Oropharynx is clear and moist EYES: Conjunctivae and EOM are normal. Pupils are equal, round, and reactive to light. No scleral icterus.  NECK: Normal range of motion, supple, no masses. SKIN: Skin is warm and dry. No rash noted. Not diaphoretic. No erythema. No pallor. NEUROLGIC: Alert and oriented to person, place, and time. Normal reflexes, muscle tone coordination. No cranial nerve deficit noted. PSYCHIATRIC: Normal mood and affect. Normal behavior. Normal judgment and thought content. CARDIOVASCULAR: Normal heart rate noted RESPIRATORY: Effort normal, no problems with respiration noted MUSCULOSKELETAL: Normal range of motion. No edema and no tenderness. ABDOMEN: Soft, nontender, nondistended, gravid. CERVIX: deferred  Fetal monitoring: FHR: 145 bpm, Variability: moderate, Accelerations: Present, Decelerations: Absent  Uterine activity: no contractions per hour  No results found for this or any previous visit (from the past 48 hour(s)).  I have reviewed the patient's current medications.  ASSESSMENT: Active Problems:   Previous cesarean delivery affecting pregnancy,  antepartum   AMA (advanced maternal age) multigravida 35+   Obesity affecting pregnancy   HSV (herpes simplex virus) anogenital infection   PROM (premature rupture of membranes)   Breech presentation   PLAN: -c-section if fetus remains breech - no s/s infection -s/pNICU consult - cont valtrex - s/p BTMZ 5/6-7 - s/p latency abx - COVID-19 testing when delivery imminent  Arabic interpretor used for interview.  Continue routine antenatal care.   Baldemar Lenis, M.D. Attending [facility] [employer] (Faculty Practice)  07/18/2018 11:26 AM

## 2018-07-19 ENCOUNTER — Encounter (HOSPITAL_COMMUNITY): Payer: Self-pay

## 2018-07-19 LAB — TYPE AND SCREEN
ABO/RH(D): O POS
Antibody Screen: NEGATIVE

## 2018-07-19 NOTE — Progress Notes (Signed)
FACULTY PRACTICE ANTEPARTUM PROGRESS NOTE  Vickie Baldwin is a 38 y.o. G3P2002 at [redacted]w[redacted]d who is admitted for PROM.  Estimated Date of Delivery: 10/05/18 Fetal presentation is breech.  Length of Stay:  9 Days. Admitted 07/10/2018  Subjective:  Patient reports normal fetal movement.  She denies uterine contractions, denies bleeding and leaking of fluid per vagina.  Vitals:  Blood pressure (!) 96/58, pulse 100, temperature 98.4 F (36.9 C), temperature source Oral, resp. rate 18, height 5\' 2"  (1.575 m), weight 92.5 kg, last menstrual period 12/25/2017, SpO2 100 %. Physical Examination: CONSTITUTIONAL: Well-developed, well-nourished female in no acute distress.  HENT:  Normocephalic, atraumatic, External right and left ear normal. Oropharynx is clear and moist EYES: Conjunctivae and EOM are normal. Pupils are equal, round, and reactive to light. No scleral icterus.  NECK: Normal range of motion, supple, no masses. SKIN: Skin is warm and dry. No rash noted. Not diaphoretic. No erythema. No pallor. NEUROLGIC: Alert and oriented to person, place, and time. Normal reflexes, muscle tone coordination. No cranial nerve deficit noted. PSYCHIATRIC: Normal mood and affect. Normal behavior. Normal judgment and thought content. CARDIOVASCULAR: Normal heart rate noted RESPIRATORY: Effort normal, no problems with respiration noted MUSCULOSKELETAL: Normal range of motion. No edema and no tenderness. ABDOMEN: Soft, nontender, nondistended, gravid. CERVIX: deferred  Fetal monitoring: FHR: 155 bpm, Variability: moderate, Accelerations: Present, Decelerations: Absent  Uterine activity: no contractions per hour  Results for orders placed or performed during the hospital encounter of 07/10/18 (from the past 48 hour(s))  Type and screen [facility]     Status: None   Collection Time: 07/19/18  7:30 AM  Result Value Ref Range   ABO/RH(D) O POS    Antibody Screen NEG    Sample Expiration     07/22/2018,2359 Performed at [facility], [address] [place], [place] [address]     I have reviewed the patient's current medications.  ASSESSMENT: Active Problems:   Previous cesarean delivery affecting pregnancy, antepartum   AMA (advanced maternal age) multigravida 35+   Obesity affecting pregnancy   HSV (herpes simplex virus) anogenital infection   PROM (premature rupture of membranes)   Breech presentation   PLAN: -c-section if fetus remains breech - no s/s infection -s/pNICU consult - cont valtrex - s/p BTMZ 5/6-7 -s/p latency abx - COVID-19 testing today   Continue routine antenatal care.   Baldemar Lenis, M.D. Attending [facility] [employer] (Faculty Practice)  07/19/2018 11:31 AM

## 2018-07-20 DIAGNOSIS — Z3A29 29 weeks gestation of pregnancy: Secondary | ICD-10-CM

## 2018-07-20 LAB — CBC
HCT: 30.1 % — ABNORMAL LOW (ref 36.0–46.0)
Hemoglobin: 9.3 g/dL — ABNORMAL LOW (ref 12.0–15.0)
MCH: 23.8 pg — ABNORMAL LOW (ref 26.0–34.0)
MCHC: 30.9 g/dL (ref 30.0–36.0)
MCV: 77 fL — ABNORMAL LOW (ref 80.0–100.0)
Platelets: 183 10*3/uL (ref 150–400)
RBC: 3.91 MIL/uL (ref 3.87–5.11)
RDW: 18.6 % — ABNORMAL HIGH (ref 11.5–15.5)
WBC: 9.4 10*3/uL (ref 4.0–10.5)
nRBC: 0 % (ref 0.0–0.2)

## 2018-07-20 LAB — NOVEL CORONAVIRUS, NAA (HOSP ORDER, SEND-OUT TO REF LAB; TAT 18-24 HRS): SARS-CoV-2, NAA: NOT DETECTED

## 2018-07-20 NOTE — Progress Notes (Signed)
Patient ID: Vickie Baldwin, female   DOB: 02-12-1981, 38 y.o.   MRN: [id number] ACULTY PRACTICE ANTEPARTUM COMPREHENSIVE PROGRESS NOTE  Vickie Baldwin is a 38 y.o. G3P2002 at [redacted]w[redacted]d  who is admitted for PROM.   Fetal presentation is breech. Length of Stay:  10  Days  Subjective: Pt without complaints this morning. Patient reports good fetal movement.  She reports no uterine contractions, no bleeding and some loss of fluid per vagina.  Vitals:  Blood pressure (!) 105/52, pulse (!) 113, temperature 98.6 F (37 C), temperature source Oral, resp. rate 18, height 5\' 2"  (1.575 m), weight 92.5 kg, last menstrual period 12/25/2017, SpO2 100 %.   Physical Examination: Lungs clear Heart RRR Abd soft + BS gravid non tender  Fetal Monitoring:  120-140's, + accels  Labs:  No results found for this or any previous visit (from the past 24 hour(s)).  Imaging Studies:    NA   Medications:  Scheduled . docusate sodium  100 mg Oral Daily  . enoxaparin (LOVENOX) injection  40 mg Subcutaneous Q24H  . famotidine  20 mg Oral BID  . prenatal multivitamin  1 tablet Oral Q1200  . sodium chloride flush  3 mL Intravenous Q12H  . valACYclovir  500 mg Oral BID   I have reviewed the patient's current medications.  ASSESSMENT: IUP 29 weeks PROM Breech H/O HSV AMA  PLAN: Stable. S/P BMZ and antibiotics. Continue with Valtrex suppression. No S/Sx of infection or labor. COVID testing negative. Check CBC today Delivery at 34 weeks or for maternal/fetal indications  Continue routine antenatal care.   Hermina Staggers 07/20/2018,9:33 AM

## 2018-07-21 NOTE — Progress Notes (Signed)
Patient ID: Vickie Baldwin, female   DOB: 1980/12/02, 38 y.o.   MRN: [id number] ACULTY PRACTICE ANTEPARTUM COMPREHENSIVE PROGRESS NOTE  Vickie Baldwin is a 38 y.o. G3P2002 at [redacted]w[redacted]d  who is admitted for PROM.   Fetal presentation is breech. Length of Stay:  11  Days  Subjective: Pt without complaints this morning. Tolerating diet.  Patient reports good fetal movement.  She reports no ut ctx or VB, still occ LOF.  Vitals:  Blood pressure (!) 124/46, pulse 94, temperature 97.9 F (36.6 C), temperature source Oral, resp. rate 17, height 5\' 2"  (1.575 m), weight 92.5 kg, last menstrual period 12/25/2017, SpO2 100 %.   Physical Examination: Lungs clear Heart RRR Abd soft + BS gravid non tender Ext non tender  Fetal Monitoring:  120's, + accels, good variablity, no ut ctx  Labs:  Results for orders placed or performed during the hospital encounter of 07/10/18 (from the past 24 hour(s))  CBC   Collection Time: 07/20/18 11:46 AM  Result Value Ref Range   WBC 9.4 4.0 - 10.5 K/uL   RBC 3.91 3.87 - 5.11 MIL/uL   Hemoglobin 9.3 (L) 12.0 - 15.0 g/dL   HCT 91.6 (L) 60.6 - 00.4 %   MCV 77.0 (L) 80.0 - 100.0 fL   MCH 23.8 (L) 26.0 - 34.0 pg   MCHC 30.9 30.0 - 36.0 g/dL   RDW 59.9 (H) 77.4 - 14.2 %   Platelets 183 150 - 400 K/uL   nRBC 0.0 0.0 - 0.2 %    Imaging Studies:    NA   Medications:  Scheduled . docusate sodium  100 mg Oral Daily  . enoxaparin (LOVENOX) injection  40 mg Subcutaneous Q24H  . famotidine  20 mg Oral BID  . prenatal multivitamin  1 tablet Oral Q1200  . sodium chloride flush  3 mL Intravenous Q12H  . valACYclovir  500 mg Oral BID   I have reviewed the patient's current medications.  ASSESSMENT: IUP 29 1/7 weeks PROM Breech presentation H/O HSV AMA  PLAN: Stable. SS/P BMZ and antibiotics. No s/sx of infection or labor. Continue with Valtrex suppression. Glucola tomorrow. Reviewed with pt. NPO after midnight for glucola.  Continue routine antenatal  care.   Hermina Staggers 07/21/2018,7:41 AM

## 2018-07-22 DIAGNOSIS — Z3483 Encounter for supervision of other normal pregnancy, third trimester: Secondary | ICD-10-CM

## 2018-07-22 LAB — TYPE AND SCREEN
ABO/RH(D): O POS
Antibody Screen: NEGATIVE

## 2018-07-22 LAB — GLUCOSE, FASTING: Glucose, Fasting: 77 mg/dL (ref 70–99)

## 2018-07-22 LAB — GLUCOSE TOLERANCE, 1 HOUR: Glucose, 1 Hour GTT: 152 mg/dL — ABNORMAL HIGH (ref 70–140)

## 2018-07-22 LAB — GLUCOSE, 2 HOUR: Glucose, 2 hour: UNDETERMINED mg/dL (ref 70–139)

## 2018-07-22 NOTE — Progress Notes (Signed)
Patient ID: Vickie Baldwin, female   DOB: [birth year], 38 y.o.   MRN: [id number] FACULTY PRACTICE ANTEPARTUM(COMPREHENSIVE) NOTE  Vickie Baldwin is a 38 y.o. G3P2002 at [redacted]w[redacted]d by best clinical estimate who is admitted for PROM.   Fetal presentation is breech. Length of Stay:  12  Days  Subjective: Feels well. No new issues Patient reports the fetal movement as active. Patient reports uterine contraction  activity as none. Patient reports  vaginal bleeding as none. Patient describes fluid per vagina as Clear.  Vitals:  Blood pressure 125/65, pulse 99, temperature 99 F (37.2 C), temperature source Oral, resp. rate 19, height 5\' 2"  (1.575 m), weight 92.5 kg, last menstrual period 12/25/2017, SpO2 100 %. Physical Examination:  General appearance - alert, well appearing, and in no distress Chest - normal effort Abdomen - gravid, non-tender Fundal Height:  size equals dates Extremities: Homans sign is negative, no sign of DVT  Membranes:ruptured, clear fluid  Fetal Monitoring:  Baseline: 145 bpm, Variability: Good {> 6 bpm), Accelerations: Reactive and Decelerations: Absent  Labs:  Results for orders placed or performed during the hospital encounter of 07/10/18 (from the past 24 hour(s))  Glucose, fasting   Collection Time: 07/22/18  6:40 AM  Result Value Ref Range   Glucose, Fasting 77 70 - 99 mg/dL  Glucose, 2 hour   Collection Time: 07/22/18  7:55 AM  Result Value Ref Range   Glucose, 2 hour 152 (H) 70 - 139 mg/dL    Medications:  Scheduled . docusate sodium  100 mg Oral Daily  . enoxaparin (LOVENOX) injection  40 mg Subcutaneous Q24H  . famotidine  20 mg Oral BID  . prenatal multivitamin  1 tablet Oral Q1200  . sodium chloride flush  3 mL Intravenous Q12H  . valACYclovir  500 mg Oral BID   I have reviewed the patient's current medications.  ASSESSMENT: Active Problems:   Previous cesarean delivery affecting pregnancy, antepartum   AMA (advanced maternal age) multigravida  35+   Obesity affecting pregnancy   HSV (herpes simplex virus) anogenital infection   PROM (premature rupture of membranes)   Breech presentation   PLAN: Awaiting results of 2 hour S/p BMZ, Mag Delivery with s/sx's of infection or deterioration in maternal/fetal status Valtrex for HSV suppression   [name], MD 07/22/2018,11:36 AM

## 2018-07-23 NOTE — Progress Notes (Signed)
FACULTY PRACTICE ANTEPARTUM(COMPREHENSIVE) NOTE  Vickie Baldwin is a 38 y.o. G3P2002 at [redacted]w[redacted]d by best clinical estimate who is admitted for pre-eclampsia with severe features.   Fetal presentation is breech. Length of Stay:  13  Days  Subjective: Feels well, no new complaints. Patient reports the fetal movement as active. Patient reports uterine contraction  activity as none. Patient reports  vaginal bleeding as none. Patient describes fluid per vagina as Clear.  Vitals:  Blood pressure 115/60, pulse 82, temperature 98.4 F (36.9 C), temperature source Oral, resp. rate 16, height 5\' 2"  (1.575 m), weight 92.5 kg, last menstrual period 12/25/2017, SpO2 100 %. Physical Examination:  General appearance - alert, well appearing, and in no distress Chest - normal effort Abdomen - gravid, non-tender Fundal Height:  size equals dates Extremities: Homans sign is negative, no sign of DVT  Membranes:ruptured, clear fluid  Fetal Monitoring:  Baseline: 150 bpm, Variability: Good {> 6 bpm), Accelerations: Reactive and Decelerations: Absent  Labs:  Results for orders placed or performed during the hospital encounter of 07/10/18 (from the past 24 hour(s))  Type and screen [facility]   Collection Time: 07/22/18  5:48 PM  Result Value Ref Range   ABO/RH(D) O POS    Antibody Screen NEG    Sample Expiration      07/25/2018,2359 Performed at [facility], [address] [place], [place] [address]      Medications:  Scheduled . docusate sodium  100 mg Oral Daily  . enoxaparin (LOVENOX) injection  40 mg Subcutaneous Q24H  . famotidine  20 mg Oral BID  . prenatal multivitamin  1 tablet Oral Q1200  . sodium chloride flush  3 mL Intravenous Q12H  . valACYclovir  500 mg Oral BID   I have reviewed the patient's current medications.  ASSESSMENT: Active Problems:   Previous cesarean delivery affecting pregnancy, antepartum   AMA (advanced maternal age) multigravida 35+  Obesity affecting pregnancy   HSV (herpes simplex virus) anogenital infection   PROM (premature rupture of membranes)   Breech presentation   PLAN: Continue inpt.until delivery Delivery with s/sx's of infection or worsening maternal/fetal status Valtrex for HSV ppx S/p BMZ and Abx   [name], MD 07/23/2018,10:28 AM

## 2018-07-24 NOTE — Progress Notes (Addendum)
Called to clarify Glucose tolerance  order with Dr Alysia Penna . Pt will need a  Fasting then 1 hr gtt only.  Rn  Called lab to clarify order and modified order.

## 2018-07-24 NOTE — Progress Notes (Signed)
Patient ID: Vickie Baldwin, female   DOB: [birth year], 38 y.o.   MRN: [id number] [facility]- ANTEPARTUM(COMPREHENSIVE) NOTE  Vickie Baldwin is a 38 y.o. G3P2002 at [redacted]w[redacted]d by best clinical estimate who is admitted for pre-eclampsia with severe features.   Fetal presentation is breech.  Length of Stay:  14  Days  Subjective: Feels well, no new complaints. Patient reports the fetal movement as active. Patient reports uterine contraction  activity as none. Patient reports  vaginal bleeding as none. Patient describes fluid per vagina as Clear.  Vitals:  Blood pressure 119/63, pulse 87, temperature 98 F (36.7 C), temperature source Oral, resp. rate 18, height 5\' 2"  (1.575 m), weight 92.5 kg, last menstrual period 12/25/2017, SpO2 100 %. Physical Examination:  General appearance - alert, well appearing, and in no distress Chest - normal effort Abdomen - gravid, non-tender Fundal Height:  size equals dates Extremities: Homans sign is negative, no sign of DVT  Membranes:ruptured, clear fluid  Fetal Monitoring:  Baseline: 150 bpm, Variability: Good {> 6 bpm), Accelerations: Reactive and Decelerations: Absent- reviewed 10 AM NST  Labs:  No results found for this or any previous visit (from the past 24 hour(s)).   Medications:  Scheduled . docusate sodium  100 mg Oral Daily  . enoxaparin (LOVENOX) injection  40 mg Subcutaneous Q24H  . famotidine  20 mg Oral BID  . prenatal multivitamin  1 tablet Oral Q1200  . sodium chloride flush  3 mL Intravenous Q12H  . valACYclovir  500 mg Oral BID   I have reviewed the patient's current medications.  ASSESSMENT: Active Problems:   Previous cesarean delivery affecting pregnancy, antepartum   AMA (advanced maternal age) multigravida 35+   Obesity affecting pregnancy   HSV (herpes simplex virus) anogenital infection   PROM (premature rupture of membranes)   Breech presentation   PLAN: Continue inpt.until delivery Delivery with  s/sx's of infection or worsening maternal/fetal status Valtrex for HSV ppx S/p BMZ and Abx 2 hr GTT Fasting 77, 152 but third value was not collected thus inconclusive testing Will order 1 hr gtt, if normal then does not need further evaluation.   Federico Flake, MD 07/24/2018,9:50 AM

## 2018-07-25 DIAGNOSIS — Z3A29 29 weeks gestation of pregnancy: Secondary | ICD-10-CM

## 2018-07-25 LAB — GLUCOSE TOLERANCE, 1 HOUR: Glucose, 1 Hour GTT: 95 mg/dL (ref 70–140)

## 2018-07-25 LAB — GLUCOSE, FASTING GESTATIONAL: Glucose Tolerance, Fasting: 95 mg/dL

## 2018-07-25 LAB — GLUCOSE, 1 HOUR GESTATIONAL: Glucose Tolerance, 1 hour: 134 mg/dL (ref 70–189)

## 2018-07-25 NOTE — Progress Notes (Addendum)
Fasting drawn by lab at 0550. Pt drank 50g of Glucocrush at 0600. Lab will redraw 1 hour gtt at 0700.

## 2018-07-25 NOTE — Progress Notes (Signed)
Patient ID: Vickie Baldwin, female   DOB: 1980/12/02, 38 y.o.   MRN: [id number] FACULTY PRACTICE ANTEPARTUM(COMPREHENSIVE) NOTE  Vickie Baldwin is a 38 y.o. G3P2002 at [redacted]w[redacted]d by best clinical estimate who is admitted for PROM.   Fetal presentation is breech. Length of Stay:  15  Days  Subjective: Previous C-section Patient reports the fetal movement as active. Patient reports uterine contraction  activity as none. Patient reports  vaginal bleeding as none. Patient describes fluid per vagina as None.  Vitals:  Blood pressure (!) 99/53, pulse 92, temperature 98.6 F (37 C), temperature source Oral, resp. rate 18, height 5\' 2"  (1.575 m), weight 92.5 kg, last menstrual period 12/25/2017, SpO2 100 %. Physical Examination:  General appearance - alert, well appearing, and in no distress Chest - normal effort Abdomen - gravid, non-tender Fundal Height:  size equals dates Extremities: Homans sign is negative, no sign of DVT  Membranes:ruptured, clear fluid  Fetal Monitoring:  Baseline: 150 bpm, Variability: Good {> 6 bpm), Accelerations: Reactive and Decelerations: Absent  Labs:  Results for orders placed or performed during the hospital encounter of 07/10/18 (from the past 24 hour(s))  Glucose, fasting gestational   Collection Time: 07/25/18  5:50 AM  Result Value Ref Range   Glucose, Fasting-Gestational 95 mg/dL  Glucose tolerance, 1 hour   Collection Time: 07/25/18  5:50 AM  Result Value Ref Range   Glucose, 1 Hour GTT 95 70 - 140 mg/dL  Glucose, 1 hour gestational   Collection Time: 07/25/18  7:09 AM  Result Value Ref Range   Glucose, 1 Hour-Gestational 134 70 - 189 mg/dL   Medications:  Scheduled . docusate sodium  100 mg Oral Daily  . enoxaparin (LOVENOX) injection  40 mg Subcutaneous Q24H  . famotidine  20 mg Oral BID  . prenatal multivitamin  1 tablet Oral Q1200  . sodium chloride flush  3 mL Intravenous Q12H  . valACYclovir  500 mg Oral BID   I have reviewed the patient's  current medications.  ASSESSMENT: Active Problems:   Previous cesarean delivery affecting pregnancy, antepartum   AMA (advanced maternal age) multigravida 35+   Obesity affecting pregnancy   HSV (herpes simplex virus) anogenital infection   PROM (premature rupture of membranes)   Breech presentation   PLAN: Continue inpt. Until delivery with s/sx's of infection, or worsening maternal or fetal status. Valtrex for HSV suppression.  [name], MD 07/25/2018,11:04 AM

## 2018-07-26 NOTE — Progress Notes (Signed)
Patient ID: Vickie Baldwin, female   DOB: [birth year], 38 y.o.   MRN: [id number] FACULTY PRACTICE ANTEPARTUM(COMPREHENSIVE) NOTE  Vickie Baldwin is a 38 y.o. G3P2002 at [redacted]w[redacted]d by best clinical estimate who is admitted for PROM.   Fetal presentation is breech. Length of Stay:  16  Days  Subjective: Doing well. No new complaints. Patient reports the fetal movement as active. Patient reports uterine contraction  activity as none. Patient reports  vaginal bleeding as none. Patient describes fluid per vagina as Clear.  Vitals:  Blood pressure (!) 103/52, pulse 90, temperature 98.6 F (37 C), temperature source Oral, resp. rate 18, height 5\' 2"  (1.575 m), weight 92.5 kg, last menstrual period 12/25/2017, SpO2 100 %. Physical Examination:  General appearance - alert, well appearing, and in no distress Chest - normal effort Abdomen - gravid, non-tender Fundal Height:  size equals dates Extremities: Homans sign is negative, no sign of DVT  Membranes:ruptured, clear fluid  Fetal Monitoring:  Baseline: 150 bpm, Variability: Good {> 6 bpm), Accelerations: Reactive and Decelerations: Absent  Medications:  Scheduled . docusate sodium  100 mg Oral Daily  . enoxaparin (LOVENOX) injection  40 mg Subcutaneous Q24H  . famotidine  20 mg Oral BID  . prenatal multivitamin  1 tablet Oral Q1200  . sodium chloride flush  3 mL Intravenous Q12H  . valACYclovir  500 mg Oral BID   I have reviewed the patient's current medications.  ASSESSMENT: Active Problems:   Previous cesarean delivery affecting pregnancy, antepartum   AMA (advanced maternal age) multigravida 35+   Obesity affecting pregnancy   HSV (herpes simplex virus) anogenital infection   PROM (premature rupture of membranes)   Breech presentation   PLAN: Continue inpt. Management.  Delivery with s/sx's of fetal or maternal worsening or infection.  [name], MD 07/26/2018,11:23 AM

## 2018-07-27 DIAGNOSIS — Z3A30 30 weeks gestation of pregnancy: Secondary | ICD-10-CM

## 2018-07-27 NOTE — Progress Notes (Signed)
Patient ID: Vickie Baldwin, female   DOB: Mar 06, 1981, 38 y.o.   MRN: [id number] FACULTY PRACTICE ANTEPARTUM(COMPREHENSIVE) NOTE  Vickie Baldwin is a 38 y.o. G3P2002 at [redacted]w[redacted]d by best clinical estimate who is admitted for PPROM.   Fetal presentation is breech. History of previous cesarean section.   Length of Stay:  17  Days  Subjective: Doing well. No new complaints. Patient reports the fetal movement as active. Patient reports uterine contraction  activity as none. Patient reports  vaginal bleeding as none. Patient describes fluid per vagina as clear.  Vitals:  Blood pressure (!) 112/41, pulse 84, temperature 98.4 F (36.9 C), temperature source Oral, resp. rate 18, height 5\' 2"  (1.575 m), weight 92.5 kg, last menstrual period 12/25/2017, SpO2 100 %. Physical Examination: General appearance - alert, well appearing, and in no distress Chest - normal effort Abdomen - gravid, non-tender Fundal Height:  size equals dates Extremities: Homans sign is negative, no sign of DVT  Membranes:ruptured, clear fluid  Fetal Monitoring:  Baseline: 140 bpm, Variability: Moderate {> 6 bpm), Accelerations: Reactive and Decelerations: rare mild variable  Medications:  Scheduled . docusate sodium  100 mg Oral Daily  . enoxaparin (LOVENOX) injection  40 mg Subcutaneous Q24H  . famotidine  20 mg Oral BID  . prenatal multivitamin  1 tablet Oral Q1200  . sodium chloride flush  3 mL Intravenous Q12H  . valACYclovir  500 mg Oral BID   I have reviewed the patient's current medications.  ASSESSMENT: Principal Problem:   Preterm premature rupture of membranes (PPROM) with unknown onset of labor in third trimester, antepartum Active Problems:   Previous cesarean delivery affecting pregnancy, antepartum   AMA (advanced maternal age) multigravida 35+   Obesity affecting pregnancy   HSV (herpes simplex virus) anogenital infection   Breech presentation   PLAN: Delivery with s/sx's of infection or worsening  maternal/fetal status Valtrex for HSV ppx S/p BMZ and Abx Continue inpatient care until delivery  Jaynie Collins, MD 07/27/2018,11:32 AM

## 2018-07-28 NOTE — Progress Notes (Signed)
Patient ID: Vickie Baldwin, female   DOB: Nov 06, 1980, 38 y.o.   MRN: [id number] FACULTY PRACTICE ANTEPARTUM(COMPREHENSIVE) NOTE  Vickie Baldwin is a 38 y.o. G3P2002 at [redacted]w[redacted]d by best clinical estimate who is admitted for PPROM.   Fetal presentation is breech. History of previous cesarean section.   Length of Stay:  18  Days  Subjective: No events overnight, no new complains. Arabic [employer] interpreter used for this encounter. Patient reports the fetal movement as active. Patient reports uterine contraction  activity as none. Patient reports  vaginal bleeding as none. Patient describes fluid per vagina as clear.  Vitals:  Blood pressure (!) 105/46, pulse 78, temperature 98.1 F (36.7 C), temperature source Oral, resp. rate 17, height 5\' 2"  (1.575 m), weight 92.5 kg, last menstrual period 12/25/2017, SpO2 100 %. Physical Examination: General appearance - alert, well appearing, and in no distress Chest - normal effort Abdomen - gravid, non-tender Fundal Height:  size equals dates Extremities: Homans sign is negative, no sign of DVT  Membranes:ruptured, clear fluid  Fetal Monitoring:  Baseline: 140 bpm, Variability: Moderate {> 6 bpm), Accelerations: Reactive and Decelerations: rare mild variable  Medications:  Scheduled . docusate sodium  100 mg Oral Daily  . enoxaparin (LOVENOX) injection  40 mg Subcutaneous Q24H  . famotidine  20 mg Oral BID  . prenatal multivitamin  1 tablet Oral Q1200  . sodium chloride flush  3 mL Intravenous Q12H  . valACYclovir  500 mg Oral BID   I have reviewed the patient's current medications.  ASSESSMENT: Principal Problem:   Preterm premature rupture of membranes (PPROM) with unknown onset of labor in third trimester, antepartum Active Problems:   Previous cesarean delivery affecting pregnancy, antepartum   AMA (advanced maternal age) multigravida 35+   Obesity affecting pregnancy   HSV (herpes simplex virus) anogenital infection   Breech  presentation   PLAN: Delivery with signs/symptoms of infection or worsening maternal/fetal status Valtrex for HSV ppx S/p BMZ and latency antibiotics [place] MFM Limited Scan for AFI ordered for this week Continue inpatient care until delivery  Jaynie Collins, MD 07/28/2018,7:16 AM

## 2018-07-29 DIAGNOSIS — O42113 Preterm premature rupture of membranes, onset of labor more than 24 hours following rupture, third trimester: Secondary | ICD-10-CM | POA: Diagnosis not present

## 2018-07-29 DIAGNOSIS — O4693 Antepartum hemorrhage, unspecified, third trimester: Secondary | ICD-10-CM | POA: Diagnosis not present

## 2018-07-29 DIAGNOSIS — Z3A38 38 weeks gestation of pregnancy: Secondary | ICD-10-CM | POA: Diagnosis not present

## 2018-07-29 LAB — CBC
HCT: 29.7 % — ABNORMAL LOW (ref 36.0–46.0)
Hemoglobin: 9.2 g/dL — ABNORMAL LOW (ref 12.0–15.0)
MCH: 23.8 pg — ABNORMAL LOW (ref 26.0–34.0)
MCHC: 31 g/dL (ref 30.0–36.0)
MCV: 76.9 fL — ABNORMAL LOW (ref 80.0–100.0)
Platelets: 152 10*3/uL (ref 150–400)
RBC: 3.86 MIL/uL — ABNORMAL LOW (ref 3.87–5.11)
RDW: 18.8 % — ABNORMAL HIGH (ref 11.5–15.5)
WBC: 7 10*3/uL (ref 4.0–10.5)
nRBC: 0 % (ref 0.0–0.2)

## 2018-07-29 LAB — URINALYSIS, ROUTINE W REFLEX MICROSCOPIC
Bacteria, UA: NONE SEEN
Bilirubin Urine: NEGATIVE
Glucose, UA: NEGATIVE mg/dL
Ketones, ur: 20 mg/dL — AB
Leukocytes,Ua: NEGATIVE
Nitrite: NEGATIVE
Protein, ur: NEGATIVE mg/dL
Specific Gravity, Urine: 1.012 (ref 1.005–1.030)
pH: 6 (ref 5.0–8.0)

## 2018-07-29 LAB — PREPARE RBC (CROSSMATCH)

## 2018-07-29 MED ORDER — SODIUM CHLORIDE 0.9% IV SOLUTION: Freq: Once | INTRAVENOUS | Status: DC

## 2018-07-29 MED ORDER — MAGNESIUM SULFATE BOLUS VIA INFUSION
6.0000 g | Freq: Once | INTRAVENOUS | Status: AC
  Administered 2018-07-29: 6 g via INTRAVENOUS
  Filled 2018-07-29: qty 500

## 2018-07-29 MED ORDER — BETAMETHASONE SOD PHOS & ACET 6 (3-3) MG/ML IJ SUSP
12.0000 mg | INTRAMUSCULAR | Status: AC
  Administered 2018-07-29 – 2018-07-30 (×2): 12 mg via INTRAMUSCULAR
  Filled 2018-07-29 (×2): qty 2

## 2018-07-29 MED ORDER — MAGNESIUM SULFATE 40 G IN LACTATED RINGERS - SIMPLE
2.0000 g/h | INTRAVENOUS | Status: DC
  Administered 2018-07-29: 14:00:00 2 g/h via INTRAVENOUS
  Filled 2018-07-29 (×2): qty 500

## 2018-07-29 MED ORDER — LACTATED RINGERS IV SOLN
INTRAVENOUS | Status: DC
  Administered 2018-07-29 – 2018-07-30 (×3): via INTRAVENOUS

## 2018-07-29 NOTE — Plan of Care (Signed)
Dr. Macon Large to the bedside to consent pt. For emergency c/s in the event the pt. Progresses and the surgery is needed. All aspects of c/s including procedure, anesthesia, neonatology, and risks explained thoroughly to patient. All questions answered. Consent signed and placed in chart in the event the emergency procedure is needed.  Interpretor used for consent. Samir #[id number].

## 2018-07-29 NOTE — Progress Notes (Signed)
Patient ID: Vickie Baldwin, female   DOB: [birth year], 38 y.o.   MRN: [id number] FACULTY PRACTICE ANTEPARTUM(COMPREHENSIVE) NOTE  Vickie Baldwin is a 38 y.o. [id number] with Estimated Date of Delivery: 10/05/18   By   [redacted]w[redacted]d  who is admitted for PROM.    Fetal presentation is breech. Length of Stay:  19  Days  Date of admission:07/10/2018  Subjective: No complaints Patient reports the fetal movement as active. Patient reports uterine contraction  activity as none. Patient reports  vaginal bleeding as none. Patient describes fluid per vagina as Clear.  Vitals:  Blood pressure 117/65, pulse 86, temperature 98.4 F (36.9 C), temperature source Oral, resp. rate 18, height 5\' 2"  (1.575 m), weight 92.5 kg, last menstrual period 12/25/2017, SpO2 100 %. Vitals:   07/28/18 1552 07/28/18 1553 07/28/18 1914 07/28/18 2306  BP:  (!) 95/50 (!) 110/49 117/65  Pulse:  85 92 86  Resp:  18 18 18   Temp: 98.7 F (37.1 C)  98.4 F (36.9 C) 98.4 F (36.9 C)  TempSrc: Oral  Oral Oral  SpO2: 100%  100% 100%  Weight:      Height:       Physical Examination:  General appearance - alert, well appearing, and in no distress Abdomen - soft, nontender, nondistended, no masses or organomegaly Fundal Height:  size equals dates Pelvic Exam:  examination not indicated Cervical Exam: Not evaluated. Extremities: extremities normal, atraumatic, no cyanosis or edema with DTRs 3+ bilaterally and 2+ bilaterally Membranes:ruptured, clear fluid  Fetal Monitoring:  Baseline: 140 bpm, Variability: Good {> 6 bpm), Accelerations: Reactive and Decelerations: Absent   reactive  Labs:  Results for orders placed or performed during the hospital encounter of 07/10/18 (from the past 24 hour(s))  Type and screen [facility]   Collection Time: 07/29/18  6:10 AM  Result Value Ref Range   ABO/RH(D) PENDING    Antibody Screen PENDING    Sample Expiration      08/01/2018,2359 Performed at [facility], [address], [place], [place] [address]     Imaging Studies:      Medications:  Scheduled . docusate sodium  100 mg Oral Daily  . enoxaparin (LOVENOX) injection  40 mg Subcutaneous Q24H  . famotidine  20 mg Oral BID  . prenatal multivitamin  1 tablet Oral Q1200  . sodium chloride flush  3 mL Intravenous Q12H  . valACYclovir  500 mg Oral BID   I have reviewed the patient's current medications.  ASSESSMENT: [id number] [redacted]w[redacted]d Estimated Date of Delivery: 10/05/18  PPROM 07/09/2018  Patient Active Problem List   Diagnosis Date Noted  . Breech presentation 07/15/2018  . Preterm premature rupture of membranes (PPROM) with unknown onset of labor in third trimester, antepartum 07/10/2018  . HSV (herpes simplex virus) anogenital infection 05/29/2018  . Previous cesarean delivery affecting pregnancy, antepartum 03/11/2018  . AMA (advanced maternal age) multigravida 35+ 03/11/2018  . Obesity affecting pregnancy 03/11/2018  . Supervision of high-risk pregnancy 02/08/2018    PLAN: >Continue in hospital management and observation >Breech so will need Caesarean section >S/P latency antibiotics plus betamethasone >continue valtrex  Luther H Eure 07/29/2018,6:33 AM

## 2018-07-29 NOTE — Progress Notes (Addendum)
Upon entering room patient looked happy to see nurse and reported pain like mild menstrual cramps. No contractions palpated. Pt reports positive fetal movement. Monitors applied.

## 2018-07-29 NOTE — Progress Notes (Signed)
[facility] OB[facility]N Attending Note  Subjective:  I was called to evaluate patient with episode of bright red bleeding while I was in the OR; was informed over the phone that the bleeding episode occurred but no further bright red bleeding noted since.  Reassuring FHR tracing.  After the procedure, I came to check on patient.  Due to language barrier, an Arabic [employer] interpreter was used during this encounter.  Patient reports no further bright red bleeding since the earlier episode, just leaking brown/red amniotic fluid since then. She does report episodic cramping which she describes like "menstrual cramps", cannot tell how often this happens. Reports good FM.   Admitted on 07/10/2018 for Preterm premature rupture of membranes (PPROM) with onset of labor after 24 hours of rupture in third trimester, antepartum.    Objective:  Blood pressure 100/63, pulse 89, temperature 98.8 F (37.1 C), temperature source Oral, resp. rate 20, height 5\' 2"  (1.575 m), weight 92.5 kg, last menstrual period 12/25/2017, SpO2 100 %. FHT  Baseline 145bpm, moderate variability, +accelerations, no decelerations Toco: no contractions traced Gen: NAD HENT: Normocephalic, atraumatic Lungs: Normal respiratory effort Heart: Regular rate noted Abdomen: soft,  gravid fundus, nontender to palpation Cervix: Small amount of blood tinged amniotic fluid noted during exam. Cervix closed. Ext: No edema, no cyanosis  Assessment & Plan:  38 y.o. G3P2002 at [redacted]w[redacted]d admitted for PPROM now with bleeding, presumed placental abruption.   Discussed that this was a risk of PPROM, emphasized need for close monitoring. Given that bleeding has subsided for now and reassuring FHR, no need for delivery at this moment. But if this recurs and is concerning, or if FHR is no longer reassuring, will proceed with cesarean delivery (also has breech fetal presentation).  Unable to do full cesarean section consenting at this point, as I have to  return to OR for another case, will return to do so later today.  In the meantime: *Maintain IV access *Patient to be NPO *Will check CBC and Type and Crossmatch for 2 pRBCs *Continuous fetal monitoring *Talked to Dr. Ruben Gottron (Neonatlogy) to inform him of this situation, he suggested repeating betamethasone course (last course was 5/6-5/7) and also magnesium sulfate for neuroprotection. This was ordered and will be given soon *No need for formal ultrasound now as it will not change management for now, it had already been ordered for tomorrow morning Will continue close observation.  Jaynie Collins, MD, FACOG Obstetrician & Gynecologist, [facility] for [employer], [facility]

## 2018-07-29 NOTE — Progress Notes (Signed)
Patient reports no longer feeling cramps and does not feel tightening, reports positive fetal movement and no bleeding, leaking or previously seen pink discharge. Encouraged hydration and frequent emptying of bladder.

## 2018-07-29 NOTE — Progress Notes (Signed)
Patient called out and reported bright red bleeding. Monitors applied. FHR 140. Dr. Macon Large notified.

## 2018-07-29 NOTE — Progress Notes (Signed)
Faculty Practice OB/GYN Attending Note  Came back to check on patient, no further bleeding. On magnesium sulfate, no signs/symptoms of toxicity. Category 1 FHR tracing.  No other immediate concerns.  Using the help of an Arabic [employer] interpreter, patient was counseled extensively about what will happen in the event of further concerning bleeding, nonreassuring fetal tracing or other emergent condition.  Patient asked about current presentation of fetus, bedside ultrasound showed fetus to be in breech presentation, head a little towards maternal right.  It was emphasized that presentation did not matter in the event of an emergency, as cesarean section will be done.  The risks of cesarean section were discussed with the patient included but were not limited to: bleeding which may require transfusion or reoperation; infection which may require antibiotics; injury to bowel, bladder, ureters or other surrounding organs; injury to the fetus; need for additional procedures including hysterectomy in the event of a life-threatening hemorrhage; placental abnormalities wth subsequent pregnancies, incisional problems, thromboembolic phenomenon, death and other postoperative/anesthesia complications. Patient asked a lot of questions that were answered to her satisfaction. The patient concurred with the proposed plan, giving informed written consent for the procedure. This consent will be placed in her physical chart.  Patient desires to have some clear sips, this will be allowed for now. Will continue magnesium sulfate and continuous FHR monitoring until at least tomorrow morning. Continue close observation.    Jaynie Collins, MD, FACOG Obstetrician & Gynecologist, [facility] for [employer], [facility]

## 2018-07-30 ENCOUNTER — Inpatient Hospital Stay (HOSPITAL_COMMUNITY): Payer: BLUE CROSS/BLUE SHIELD

## 2018-07-30 DIAGNOSIS — O3413 Maternal care for benign tumor of corpus uteri, third trimester: Secondary | ICD-10-CM

## 2018-07-30 DIAGNOSIS — O09523 Supervision of elderly multigravida, third trimester: Secondary | ICD-10-CM

## 2018-07-30 DIAGNOSIS — O4693 Antepartum hemorrhage, unspecified, third trimester: Secondary | ICD-10-CM

## 2018-07-30 DIAGNOSIS — O99213 Obesity complicating pregnancy, third trimester: Secondary | ICD-10-CM

## 2018-07-30 DIAGNOSIS — O42913 Preterm premature rupture of membranes, unspecified as to length of time between rupture and onset of labor, third trimester: Secondary | ICD-10-CM

## 2018-07-30 DIAGNOSIS — O34219 Maternal care for unspecified type scar from previous cesarean delivery: Secondary | ICD-10-CM

## 2018-07-30 DIAGNOSIS — Z3A31 31 weeks gestation of pregnancy: Secondary | ICD-10-CM

## 2018-07-30 LAB — CULTURE, OB URINE: Culture: NO GROWTH

## 2018-07-30 NOTE — Progress Notes (Signed)
Patient ID: Vickie Baldwin, female   DOB: 1980/12/01, 38 y.o.   MRN: [id number] FACULTY PRACTICE ANTEPARTUM(COMPREHENSIVE) NOTE  Vickie Baldwin is a 38 y.o. G3P2002 at [redacted]w[redacted]d by best clinical estimate who is admitted for PPROM.   Fetal presentation is breech. History of previous cesarean section. Had episode of bleeding on 07/29/18 concerning for placental abruption.  Length of Stay:  20  Days  Subjective: No further bleeding or other events overnight. Arabic [employer] interpreter used for this encounter. Patient reports the fetal movement as active. Patient reports uterine contraction  activity as none. Patient reports  vaginal bleeding as none. Patient describes fluid per vagina as pink.  Vitals:  Blood pressure 111/67, pulse 82, temperature 97.7 F (36.5 C), temperature source Oral, resp. rate 18, height 5\' 2"  (1.575 m), weight 92.5 kg, last menstrual period 12/25/2017, SpO2 100 %. Physical Examination: General appearance - alert, well appearing, and in no distress Chest - normal effort Abdomen - soft, gravid, non-tender Fundal Height:  size equals dates Extremities: Homans sign is negative, no sign of DVT  Membranes:ruptured, clear fluid  Fetal Monitoring:  Baseline: 140 bpm, Variability: Moderate {> 6 bpm), Accelerations: Reactive and Decelerations: rare mild variable  Medications:  Scheduled . sodium chloride   Intravenous Once  . betamethasone acetate-betamethasone sodium phosphate  12 mg Intramuscular Q24 Hr x 2  . docusate sodium  100 mg Oral Daily  . enoxaparin (LOVENOX) injection  40 mg Subcutaneous Q24H  . famotidine  20 mg Oral BID  . prenatal multivitamin  1 tablet Oral Q1200  . sodium chloride flush  3 mL Intravenous Q12H  . valACYclovir  500 mg Oral BID   I have reviewed the patient's current medications.  ASSESSMENT: Principal Problem:   Preterm premature rupture of membranes (PPROM) with unknown onset of labor in third trimester, antepartum Active Problems:  Previous cesarean delivery affecting pregnancy, antepartum   AMA (advanced maternal age) multigravida 35+   Obesity affecting pregnancy   HSV (herpes simplex virus) anogenital infection   Breech presentation   Vaginal bleeding in pregnancy, third trimester   PLAN: Patient will get scan by MFM this morning. If all remains stable, will discontinue magnesium sulfate this morning.   She will receive second dose of betamethasone today. If stable, will resume regular diet later this morning Maintain IV access, check CBC and T&S q72h. Delivery indicated with concerning or heavy bleeding, signs/symptoms of infection or worsening maternal/fetal status Continue Valtrex for HSV ppx S/p BMZ 5/6-5/7 and latency antibiotics Continue inpatient care until delivery  Jaynie Collins, MD 07/30/2018,6:49 AM

## 2018-07-31 ENCOUNTER — Encounter (HOSPITAL_COMMUNITY)
Admission: AD | Disposition: A | Discharge status: Home / Self Care | Payer: BLUE CROSS/BLUE SHIELD | Source: Home / Self Care | Attending: Obstetrics and Gynecology

## 2018-07-31 ENCOUNTER — Inpatient Hospital Stay (HOSPITAL_COMMUNITY): Payer: BLUE CROSS/BLUE SHIELD | Admitting: Anesthesiology

## 2018-07-31 ENCOUNTER — Inpatient Hospital Stay (HOSPITAL_COMMUNITY): Payer: BLUE CROSS/BLUE SHIELD

## 2018-07-31 DIAGNOSIS — O34219 Maternal care for unspecified type scar from previous cesarean delivery: Secondary | ICD-10-CM

## 2018-07-31 DIAGNOSIS — O4693 Antepartum hemorrhage, unspecified, third trimester: Secondary | ICD-10-CM

## 2018-07-31 DIAGNOSIS — Z3A27 27 weeks gestation of pregnancy: Secondary | ICD-10-CM

## 2018-07-31 DIAGNOSIS — Z364 Encounter for antenatal screening for fetal growth retardation: Secondary | ICD-10-CM

## 2018-07-31 DIAGNOSIS — O42112 Preterm premature rupture of membranes, onset of labor more than 24 hours following rupture, second trimester: Secondary | ICD-10-CM

## 2018-07-31 DIAGNOSIS — O09523 Supervision of elderly multigravida, third trimester: Secondary | ICD-10-CM

## 2018-07-31 DIAGNOSIS — O321XX0 Maternal care for breech presentation, not applicable or unspecified: Secondary | ICD-10-CM

## 2018-07-31 DIAGNOSIS — Z3A31 31 weeks gestation of pregnancy: Secondary | ICD-10-CM

## 2018-07-31 DIAGNOSIS — Z302 Encounter for sterilization: Secondary | ICD-10-CM

## 2018-07-31 DIAGNOSIS — O34211 Maternal care for low transverse scar from previous cesarean delivery: Secondary | ICD-10-CM

## 2018-07-31 DIAGNOSIS — O42913 Preterm premature rupture of membranes, unspecified as to length of time between rupture and onset of labor, third trimester: Secondary | ICD-10-CM

## 2018-07-31 DIAGNOSIS — O99213 Obesity complicating pregnancy, third trimester: Secondary | ICD-10-CM

## 2018-07-31 SURGERY — Surgical Case
Anesthesia: Spinal

## 2018-07-31 MED ORDER — OXYCODONE HCL 5 MG PO TABS: 5.0000 mg | ORAL_TABLET | Freq: Once | ORAL | Status: DC | PRN

## 2018-07-31 MED ORDER — WITCH HAZEL-GLYCERIN EX PADS: 1.0000 "application " | MEDICATED_PAD | CUTANEOUS | Status: DC | PRN

## 2018-07-31 MED ORDER — SOD CITRATE-CITRIC ACID 500-334 MG/5ML PO SOLN
30.0000 mL | ORAL | Status: AC
  Administered 2018-07-31: 18:00:00 30 mL via ORAL

## 2018-07-31 MED ORDER — NALBUPHINE HCL 10 MG/ML IJ SOLN: 5.0000 mg | Freq: Once | INTRAMUSCULAR | Status: DC | PRN

## 2018-07-31 MED ORDER — ACETAMINOPHEN 160 MG/5ML PO SOLN: 325.0000 mg | ORAL | Status: DC | PRN

## 2018-07-31 MED ORDER — OXYCODONE-ACETAMINOPHEN 5-325 MG PO TABS
1.0000 | ORAL_TABLET | ORAL | Status: DC | PRN
  Administered 2018-08-01 (×2): 1 via ORAL
  Administered 2018-08-02 – 2018-08-03 (×5): 2 via ORAL
  Filled 2018-07-31: qty 1
  Filled 2018-07-31 (×4): qty 2
  Filled 2018-07-31: qty 1
  Filled 2018-07-31 (×2): qty 2

## 2018-07-31 MED ORDER — ZOLPIDEM TARTRATE 5 MG PO TABS: 5.0000 mg | ORAL_TABLET | Freq: Every evening | ORAL | Status: DC | PRN

## 2018-07-31 MED ORDER — SODIUM CHLORIDE 0.9 % IV SOLN
INTRAVENOUS | Status: DC | PRN
  Administered 2018-07-31: 18:00:00 via INTRAVENOUS

## 2018-07-31 MED ORDER — ENOXAPARIN SODIUM 40 MG/0.4ML ~~LOC~~ SOLN
40.0000 mg | SUBCUTANEOUS | Status: DC
  Administered 2018-08-01 – 2018-08-02 (×2): 40 mg via SUBCUTANEOUS
  Filled 2018-07-31 (×2): qty 0.4

## 2018-07-31 MED ORDER — KETOROLAC TROMETHAMINE 30 MG/ML IJ SOLN
30.0000 mg | Freq: Four times a day (QID) | INTRAMUSCULAR | Status: AC
  Administered 2018-07-31 – 2018-08-01 (×4): 30 mg via INTRAVENOUS
  Filled 2018-07-31 (×4): qty 1

## 2018-07-31 MED ORDER — OXYTOCIN 40 UNITS IN NORMAL SALINE INFUSION - SIMPLE MED: 2.5000 [IU]/h | INTRAVENOUS | Status: AC

## 2018-07-31 MED ORDER — POLYSACCHARIDE IRON COMPLEX 150 MG PO CAPS
150.0000 mg | ORAL_CAPSULE | Freq: Every day | ORAL | Status: DC
  Administered 2018-07-31: 12:00:00 150 mg via ORAL
  Filled 2018-07-31: qty 1

## 2018-07-31 MED ORDER — TERBUTALINE SULFATE 1 MG/ML IJ SOLN
INTRAMUSCULAR | Status: AC
  Filled 2018-07-31: qty 1

## 2018-07-31 MED ORDER — LACTATED RINGERS IV SOLN
INTRAVENOUS | Status: DC | PRN
  Administered 2018-07-31: 18:00:00 via INTRAVENOUS

## 2018-07-31 MED ORDER — SCOPOLAMINE 1 MG/3DAYS TD PT72
1.0000 | MEDICATED_PATCH | Freq: Once | TRANSDERMAL | Status: DC
  Filled 2018-07-31: qty 1

## 2018-07-31 MED ORDER — FENTANYL CITRATE (PF) 100 MCG/2ML IJ SOLN
INTRAMUSCULAR | Status: DC | PRN
  Administered 2018-07-31: 15 ug via INTRATHECAL

## 2018-07-31 MED ORDER — OXYTOCIN 40 UNITS IN NORMAL SALINE INFUSION - SIMPLE MED: INTRAVENOUS | Status: DC | PRN

## 2018-07-31 MED ORDER — ACETAMINOPHEN 325 MG PO TABS: 325.0000 mg | ORAL_TABLET | ORAL | Status: DC | PRN

## 2018-07-31 MED ORDER — PRENATAL MULTIVITAMIN CH
1.0000 | ORAL_TABLET | Freq: Every day | ORAL | Status: DC
  Administered 2018-08-01 – 2018-08-02 (×2): 1 via ORAL
  Filled 2018-07-31 (×2): qty 1

## 2018-07-31 MED ORDER — KETOROLAC TROMETHAMINE 30 MG/ML IJ SOLN: 30.0000 mg | Freq: Four times a day (QID) | INTRAMUSCULAR | Status: AC | PRN

## 2018-07-31 MED ORDER — MEPERIDINE HCL 25 MG/ML IJ SOLN: 6.2500 mg | INTRAMUSCULAR | Status: DC | PRN

## 2018-07-31 MED ORDER — SODIUM CHLORIDE 0.9 % IV SOLN
INTRAVENOUS | Status: DC | PRN
  Administered 2018-07-31: 18:00:00 40 [IU] via INTRAVENOUS

## 2018-07-31 MED ORDER — GABAPENTIN 100 MG PO CAPS
100.0000 mg | ORAL_CAPSULE | Freq: Two times a day (BID) | ORAL | Status: DC
  Administered 2018-07-31 – 2018-08-02 (×5): 100 mg via ORAL
  Filled 2018-07-31 (×5): qty 1

## 2018-07-31 MED ORDER — ONDANSETRON HCL 4 MG/2ML IJ SOLN: 4.0000 mg | Freq: Once | INTRAMUSCULAR | Status: DC | PRN

## 2018-07-31 MED ORDER — SIMETHICONE 80 MG PO CHEW: 80.0000 mg | CHEWABLE_TABLET | ORAL | Status: DC | PRN

## 2018-07-31 MED ORDER — NALOXONE HCL 0.4 MG/ML IJ SOLN: 0.4000 mg | INTRAMUSCULAR | Status: DC | PRN

## 2018-07-31 MED ORDER — FENTANYL CITRATE (PF) 100 MCG/2ML IJ SOLN
INTRAMUSCULAR | Status: AC
  Filled 2018-07-31: qty 2

## 2018-07-31 MED ORDER — MENTHOL 3 MG MT LOZG: 1.0000 | LOZENGE | OROMUCOSAL | Status: DC | PRN

## 2018-07-31 MED ORDER — OXYTOCIN 40 UNITS IN NORMAL SALINE INFUSION - SIMPLE MED
INTRAVENOUS | Status: AC
  Filled 2018-07-31: qty 1000

## 2018-07-31 MED ORDER — ONDANSETRON HCL 4 MG/2ML IJ SOLN: 4.0000 mg | Freq: Three times a day (TID) | INTRAMUSCULAR | Status: DC | PRN

## 2018-07-31 MED ORDER — MORPHINE SULFATE (PF) 0.5 MG/ML IJ SOLN
INTRAMUSCULAR | Status: AC
  Filled 2018-07-31: qty 10

## 2018-07-31 MED ORDER — SIMETHICONE 80 MG PO CHEW
80.0000 mg | CHEWABLE_TABLET | Freq: Three times a day (TID) | ORAL | Status: DC
  Administered 2018-08-01 – 2018-08-02 (×6): 80 mg via ORAL
  Filled 2018-07-31 (×6): qty 1

## 2018-07-31 MED ORDER — NALOXONE HCL 4 MG/10ML IJ SOLN
1.0000 ug/kg/h | INTRAVENOUS | Status: DC | PRN
  Filled 2018-07-31: qty 5

## 2018-07-31 MED ORDER — DIPHENHYDRAMINE HCL 25 MG PO CAPS: 25.0000 mg | ORAL_CAPSULE | ORAL | Status: DC | PRN

## 2018-07-31 MED ORDER — OXYCODONE HCL 5 MG/5ML PO SOLN: 5.0000 mg | Freq: Once | ORAL | Status: DC | PRN

## 2018-07-31 MED ORDER — TETANUS-DIPHTH-ACELL PERTUSSIS 5-2.5-18.5 LF-MCG/0.5 IM SUSP: 0.5000 mL | Freq: Once | INTRAMUSCULAR | Status: DC

## 2018-07-31 MED ORDER — ONDANSETRON HCL 4 MG/2ML IJ SOLN
INTRAMUSCULAR | Status: DC | PRN
  Administered 2018-07-31: 4 mg via INTRAVENOUS

## 2018-07-31 MED ORDER — BUPIVACAINE HCL (PF) 0.25 % IJ SOLN
INTRAMUSCULAR | Status: AC
  Filled 2018-07-31: qty 30

## 2018-07-31 MED ORDER — LACTATED RINGERS IV SOLN
INTRAVENOUS | Status: DC
  Administered 2018-08-01: 03:00:00 via INTRAVENOUS

## 2018-07-31 MED ORDER — PHENYLEPHRINE HCL-NACL 20-0.9 MG/250ML-% IV SOLN
INTRAVENOUS | Status: AC
  Filled 2018-07-31: qty 250

## 2018-07-31 MED ORDER — MORPHINE SULFATE (PF) 0.5 MG/ML IJ SOLN
INTRAMUSCULAR | Status: DC | PRN
  Administered 2018-07-31: .15 mg via INTRATHECAL

## 2018-07-31 MED ORDER — SOD CITRATE-CITRIC ACID 500-334 MG/5ML PO SOLN
ORAL | Status: AC
  Administered 2018-07-31: 30 mL via ORAL
  Filled 2018-07-31: qty 30

## 2018-07-31 MED ORDER — IBUPROFEN 800 MG PO TABS
800.0000 mg | ORAL_TABLET | Freq: Four times a day (QID) | ORAL | Status: DC
  Administered 2018-08-01 – 2018-08-03 (×8): 800 mg via ORAL
  Filled 2018-07-31 (×8): qty 1

## 2018-07-31 MED ORDER — CEFAZOLIN SODIUM-DEXTROSE 2-4 GM/100ML-% IV SOLN
2.0000 g | INTRAVENOUS | Status: AC
  Administered 2018-07-31: 18:00:00 2 g via INTRAVENOUS
  Filled 2018-07-31: qty 100

## 2018-07-31 MED ORDER — SODIUM CHLORIDE 0.9 % IV SOLN
500.0000 mg | INTRAVENOUS | Status: AC
  Administered 2018-07-31: 500 mg via INTRAVENOUS

## 2018-07-31 MED ORDER — SENNOSIDES-DOCUSATE SODIUM 8.6-50 MG PO TABS
2.0000 | ORAL_TABLET | ORAL | Status: DC
  Administered 2018-07-31 – 2018-08-01 (×2): 2 via ORAL
  Filled 2018-07-31 (×2): qty 2

## 2018-07-31 MED ORDER — ONDANSETRON HCL 4 MG/2ML IJ SOLN
INTRAMUSCULAR | Status: AC
  Filled 2018-07-31: qty 2

## 2018-07-31 MED ORDER — NALBUPHINE HCL 10 MG/ML IJ SOLN: 5.0000 mg | INTRAMUSCULAR | Status: DC | PRN

## 2018-07-31 MED ORDER — SODIUM CHLORIDE 0.9 % IV SOLN
INTRAVENOUS | Status: DC | PRN
  Administered 2018-07-31: 18:00:00 60 ug/min via INTRAVENOUS

## 2018-07-31 MED ORDER — COCONUT OIL OIL: 1.0000 "application " | TOPICAL_OIL | Status: DC | PRN

## 2018-07-31 MED ORDER — FENTANYL CITRATE (PF) 100 MCG/2ML IJ SOLN: 25.0000 ug | INTRAMUSCULAR | Status: DC | PRN

## 2018-07-31 MED ORDER — DIPHENHYDRAMINE HCL 25 MG PO CAPS: 25.0000 mg | ORAL_CAPSULE | Freq: Four times a day (QID) | ORAL | Status: DC | PRN

## 2018-07-31 MED ORDER — BUPIVACAINE IN DEXTROSE 0.75-8.25 % IT SOLN
INTRATHECAL | Status: DC | PRN
  Administered 2018-07-31: 1.5 mL via INTRATHECAL

## 2018-07-31 MED ORDER — DIBUCAINE (PERIANAL) 1 % EX OINT: 1.0000 "application " | TOPICAL_OINTMENT | CUTANEOUS | Status: DC | PRN

## 2018-07-31 MED ORDER — DIPHENHYDRAMINE HCL 50 MG/ML IJ SOLN: 12.5000 mg | INTRAMUSCULAR | Status: DC | PRN

## 2018-07-31 MED ORDER — MEASLES, MUMPS & RUBELLA VAC IJ SOLR: 0.5000 mL | Freq: Once | INTRAMUSCULAR | Status: DC

## 2018-07-31 MED ORDER — SODIUM CHLORIDE 0.9% FLUSH: 3.0000 mL | INTRAVENOUS | Status: DC | PRN

## 2018-07-31 MED ORDER — SIMETHICONE 80 MG PO CHEW
80.0000 mg | CHEWABLE_TABLET | ORAL | Status: DC
  Administered 2018-07-31 – 2018-08-01 (×2): 80 mg via ORAL
  Filled 2018-07-31 (×2): qty 1

## 2018-07-31 MED ORDER — SODIUM CHLORIDE 0.9 % IV SOLN
INTRAVENOUS | Status: AC
  Filled 2018-07-31: qty 500

## 2018-07-31 MED ORDER — PHENYLEPHRINE HCL (PRESSORS) 10 MG/ML IV SOLN
INTRAVENOUS | Status: DC | PRN
  Administered 2018-07-31 (×2): 80 ug via INTRAVENOUS

## 2018-07-31 SURGICAL SUPPLY — 37 items
BENZOIN TINCTURE PRP APPL 2/3 (GAUZE/BANDAGES/DRESSINGS) ×3 IMPLANT
CHLORAPREP W/TINT 26ML (MISCELLANEOUS) ×3 IMPLANT
CLAMP CORD UMBIL (MISCELLANEOUS) IMPLANT
CLOSURE WOUND 1/2 X4 (GAUZE/BANDAGES/DRESSINGS) ×1
CLOTH BEACON ORANGE TIMEOUT ST (SAFETY) ×3 IMPLANT
DRSG OPSITE POSTOP 4X10 (GAUZE/BANDAGES/DRESSINGS) ×3 IMPLANT
DRSG PAD ABDOMINAL 8X10 ST (GAUZE/BANDAGES/DRESSINGS) ×3 IMPLANT
ELECT REM PT RETURN 9FT ADLT (ELECTROSURGICAL) ×3
ELECTRODE REM PT RTRN 9FT ADLT (ELECTROSURGICAL) ×1 IMPLANT
EXTRACTOR VACUUM M CUP 4 TUBE (SUCTIONS) IMPLANT
EXTRACTOR VACUUM M CUP 4' TUBE (SUCTIONS)
GLOVE BIOGEL PI IND STRL 7.0 (GLOVE) ×2 IMPLANT
GLOVE BIOGEL PI INDICATOR 7.0 (GLOVE) ×4
GLOVE ECLIPSE 7.0 STRL STRAW (GLOVE) ×6 IMPLANT
GOWN STRL REUS W/TWL LRG LVL3 (GOWN DISPOSABLE) ×6 IMPLANT
KIT ABG SYR 3ML LUER SLIP (SYRINGE) IMPLANT
NEEDLE HYPO 22GX1.5 SAFETY (NEEDLE) ×3 IMPLANT
NEEDLE HYPO 25X5/8 SAFETYGLIDE (NEEDLE) IMPLANT
NS IRRIG 1000ML POUR BTL (IV SOLUTION) ×3 IMPLANT
PACK C SECTION WH (CUSTOM PROCEDURE TRAY) ×3 IMPLANT
PAD ABD 7.5X8 STRL (GAUZE/BANDAGES/DRESSINGS) ×3 IMPLANT
PAD OB MATERNITY 4.3X12.25 (PERSONAL CARE ITEMS) ×3 IMPLANT
PENCIL SMOKE EVAC W/HOLSTER (ELECTROSURGICAL) ×3 IMPLANT
RTRCTR C-SECT PINK 25CM LRG (MISCELLANEOUS) ×3 IMPLANT
SPONGE GAUZE 2X2 8PLY STER LF (GAUZE/BANDAGES/DRESSINGS) ×2
SPONGE GAUZE 2X2 8PLY STRL LF (GAUZE/BANDAGES/DRESSINGS) ×4 IMPLANT
SPONGE GAUZE 4X4 12PLY STER LF (GAUZE/BANDAGES/DRESSINGS) ×3 IMPLANT
STRIP CLOSURE SKIN 1/2X4 (GAUZE/BANDAGES/DRESSINGS) ×2 IMPLANT
STRIP CLOSURE SKIN 1X5 (GAUZE/BANDAGES/DRESSINGS) ×2 IMPLANT
STRIP CLOSURE STERI-STRIP 1X5 (GAUZE/BANDAGES/DRESSINGS) ×1
SUT VIC AB 0 CTX 36 (SUTURE) ×8
SUT VIC AB 0 CTX36XBRD ANBCTRL (SUTURE) ×4 IMPLANT
SUT VIC AB 4-0 KS 27 (SUTURE) ×3 IMPLANT
SYR 30ML LL (SYRINGE) ×3 IMPLANT
TOWEL OR 17X24 6PK STRL BLUE (TOWEL DISPOSABLE) ×3 IMPLANT
TRAY FOLEY W/BAG SLVR 14FR LF (SET/KITS/TRAYS/PACK) ×3 IMPLANT
WATER STERILE IRR 1000ML POUR (IV SOLUTION) ×3 IMPLANT

## 2018-07-31 NOTE — Anesthesia Procedure Notes (Signed)
Spinal  Patient location during procedure: OR Start time: 07/31/2018 5:45 PM End time: 07/31/2018 5:50 PM Staffing Anesthesiologist: Bethena Midget, MD Preanesthetic Checklist Completed: patient identified, site marked, surgical consent, pre-op evaluation, timeout performed, IV checked, risks and benefits discussed and monitors and equipment checked Spinal Block Patient position: sitting Prep: DuraPrep Patient monitoring: heart rate, cardiac monitor, continuous pulse ox and blood pressure Approach: midline Location: L3-4 Injection technique: single-shot Needle Needle type: Sprotte  Needle gauge: 24 G Needle length: 9 cm Assessment Sensory level: T4

## 2018-07-31 NOTE — Progress Notes (Addendum)
Daily Antepartum Note  Admission Date: 07/10/2018 Current Date: 07/31/2018 9:58 AM  Vickie Baldwin is a 38 y.o. G3P2002 @ 3650w4d, HD[facility], [facility]  Pregnancy complicated by: Patient Active Problem List   Diagnosis Date Noted  . Vaginal bleeding in pregnancy, third trimester 07/29/2018  . Breech presentation 07/15/2018  . Preterm premature rupture of membranes (PPROM) with unknown onset of labor in third trimester, antepartum 07/10/2018  . HSV (herpes simplex virus) anogenital infection 05/29/2018  . Previous cesarean delivery affecting pregnancy, antepartum 03/11/2018  . AMA (advanced maternal age) multigravida 35+ 03/11/2018  . Obesity affecting pregnancy 03/11/2018  . Supervision of high-risk pregnancy 02/08/2018    Overnight/24hr events:  none  Subjective:  No bleeding and no s/s of PTL or decreased FM  Objective:    Current Vital Signs 24h Vital Sign Ranges  T 98.6 F (37 C) Temp  Avg: 98 F (36.7 C)  Min: 97 F (36.1 C)  Max: 98.6 F (37 C)  BP (!) 101/51 BP  Min: 101/51  Max: 121/96  HR 82 Pulse  Avg: 87.7  Min: 81  Max: 96  RR 16 Resp  Avg: 16.6  Min: 16  Max: 18  SaO2 98 % Room Air SpO2  Avg: 99.6 %  Min: 98 %  Max: 100 %       24 Hour I/O Current Shift I/O  Time Ins Outs 05/26 0701 - 05/27 0700 In: 1880.6 [P.O.:1760; I.V.:120.6] Out: 1250 [Urine:1250] 05/27 0701 - 05/27 1900 In: 480 [P.O.:480] Out: 700 [Urine:700]   FHT: 140 baseline, +accels, no decel, mod variability Toco: quiet x 5792m  Physical exam: General: Well nourished, well developed female in no acute distress. Abdomen: gravid, nttp Cardiovascular: S1, S2 normal, no murmur, rub or gallop, regular rate and rhythm Respiratory: CTAB Extremities: no clubbing, cyanosis or edema Skin: Warm and dry.   Medications: Current Facility-Administered Medications  Medication Dose Route Frequency Provider Last Rate Last Dose  . 0.9 %  sodium chloride infusion (Manually program via Guardrails  IV Fluids)   Intravenous Once Anyanwu, Ugonna A, MD      . acetaminophen (TYLENOL) tablet 650 mg  650 mg Oral Q4H PRN Levie HeritageStinson, Jacob J, DO   650 mg at 07/31/18 0911  . calcium carbonate (TUMS - dosed in mg elemental calcium) chewable tablet 400 mg of elemental calcium  2 tablet Oral Q4H PRN Levie HeritageStinson, Jacob J, DO   400 mg of elemental calcium at 07/29/18 2123  . diphenhydrAMINE (BENADRYL) capsule 25 mg  25 mg Oral QHS PRN Conan Bowensavis, Kelly M, MD   25 mg at 07/30/18 2212  . docusate sodium (COLACE) capsule 100 mg  100 mg Oral Daily Levie HeritageStinson, Jacob J, DO   100 mg at 07/30/18 [id number]  . famotidine (PEPCID) tablet 20 mg  20 mg Oral BID Levie HeritageStinson, Jacob J, DO   20 mg at 07/30/18 2213  . lactated ringers infusion   Intravenous Continuous Anyanwu, Jethro BastosUgonna A, MD   Stopped at 07/30/18 0840  . ondansetron (ZOFRAN-ODT) disintegrating tablet 4 mg  4 mg Oral Q8H PRN Levie HeritageStinson, Jacob J, DO   4 mg at 07/29/18 2121  . prenatal multivitamin tablet 1 tablet  1 tablet Oral Q1200 Levie HeritageStinson, Jacob J, DO   1 tablet at 07/30/18 1154  . simethicone (MYLICON) chewable tablet 80 mg  80 mg Oral QID PRN Conan Bowensavis, Kelly M, MD      . sodium chloride flush (NS) 0.9 % injection 3 mL  3 mL Intravenous  Q12H Adam Phenix, MD   3 mL at 07/30/18 2213  . valACYclovir (VALTREX) tablet 500 mg  500 mg Oral BID Levie Heritage, DO   500 mg at 07/30/18 2213    Labs:  Recent Labs  Lab 07/29/18 1050  WBC 7.0  HGB 9.2*  HCT 29.7*  PLT 152    Recent Labs  Lab 07/25/18 0550  GLUCOSE 95     Radiology:  5/27 prelim: breech, bbp 6/8 (fluid), anhydraminos with stomach and bladder noted 5/6: efw 67%, AC 74%, 1312gm, breech  Assessment & Plan:  Pt doing well *Pregnancy: reactive NST. qday NSTs. Has growth u/s next week -s/p normal GTT *PPROM: no issues. Try to get to 34wks -s/p latency abx *Preterm: no issues -s/p rescue bmz on 5/25 and 5/26 -s/p NICU consult *VB on 5/25: O POS. last pink spotting on 5/26. qwk BPPs per MFM *Anemia: qday iron  ordered *HSV at 21wks: on valtrex suppression. Pt for c-section with delivery *Breech: c-section for delivery *H/o prior c-section: see above. *AMA: no issues *PPx:  SCDs, OOB ad lib *FEN/GI: regular diet, SLIV *Dispo: hope for 34wk delivery  Phone interpreter used  Cornelia Copa. MD Attending Center for [facility])

## 2018-07-31 NOTE — Op Note (Signed)
Cesarean Section Operative Report  PATIENT: Vickie Baldwin  PROCEDURE DATE: 07/31/2018  PREOPERATIVE DIAGNOSES: Intrauterine pregnancy at [redacted]w[redacted]d weeks gestation; malpresentation: breech with PPROM at [redacted] weeks GA now in labor   POSTOPERATIVE DIAGNOSES: The same  PROCEDURE: Repeat Low Transverse Cesarean Section  SURGEON:   Surgeon(s) and Role:    [name], MD - Primary    * Arvilla Market, DO - Assisting - OB Fellow   INDICATIONS: Vickie Baldwin is a 38 y.o. G3P2002 at [redacted]w[redacted]d here for cesarean section secondary to the indications listed under preoperative diagnoses; please see preoperative note for further details.  The risks of cesarean section were discussed with the patient including but were not limited to: bleeding which may require transfusion or reoperation; infection which may require antibiotics; injury to bowel, bladder, ureters or other surrounding organs; injury to the fetus; need for additional procedures including hysterectomy in the event of a life-threatening hemorrhage; placental abnormalities wth subsequent pregnancies, incisional problems, thromboembolic phenomenon and other postoperative/anesthesia complications.   The patient concurred with the proposed plan, giving informed written consent for the procedure.    FINDINGS:  Viable female infant in double footing breech presentation. Weight: 1720g. Apgars 2 and 7.  Clear amniotic fluid.  Intact placenta, three vessel cord.  Normal uterus, fallopian tubes and ovaries bilaterally.  ANESTHESIA: Spinal INTRAVENOUS FLUIDS: 1600 mL  ESTIMATED BLOOD LOSS: 208 mL URINE OUTPUT:  1400 ml SPECIMENS: Placenta sent to pathology COMPLICATIONS: None immediate  PROCEDURE IN DETAIL:  The patient preoperatively received intravenous antibiotics and had sequential compression devices applied to her lower extremities.  She was then taken to the operating room where spinal anesthesia was administeredand was found to be adequate. She  was then placed in a dorsal supine position with a leftward tilt, and prepped and draped in a sterile manner.  A foley catheter was placed into her bladder and attached to constant gravity.    After an adequate timeout was performed, a Pfannenstiel skin incision was made with scalpel and carried through to the underlying layer of fascia. The fascia was incised in the midline, and this incision was extended bilaterally using the Mayo scissors.  Kocher clamps were applied to the superior aspect of the fascial incision and the underlying rectus muscles were dissected off bluntly.  A similar process was carried out on the inferior aspect of the fascial incision. The rectus muscles were separated in the midline bluntly and the peritoneum was entered bluntly. Attention was turned to the lower uterine segment where a low transverse hysterotomy was made with a scalpel and extended bilaterally bluntly.  The infant was successfully delivered, the cord was clamped and cut after one minute, and the infant was handed over to the awaiting neonatology team. Uterine massage was then administered, and the placenta delivered intact with a three-vessel cord. The uterus was then cleared of clots and debris.  The hysterotomy was closed with 0 Vicryl in a running locked fashion, and an imbricating layer was also placed with 0 Vicryl.  Figure-of-eight 0 Vicryl serosal stitches were placed to help with hemostasis.  The pelvis was cleared of all clot and debris. Hemostasis was confirmed on all surfaces.  The peritoneum was closed with a 0 Vicryl running stitch. The fascia was then closed using 0 Vicryl in a running fashion.  The subcutaneous layer was irrigated, then reapproximated with 2-0 plain gut running stitches, and 30 ml of 0.5% Marcaine was injected subcutaneously around the incision.  The skin was closed  with a 4-0 Vicryl subcuticular stitch.   The patient tolerated the procedure well. Sponge, lap, instrument and needle counts  were correct x 3.  She was taken to the recovery room in stable condition.   An experienced assistant was required given the standard of surgical care given the complexity of the case.  This assistant was needed for exposure, dissection, suctioning, retraction, instrument exchange, assisting with delivery with administration of fundal pressure, and for overall help during the procedure.   Maternal Disposition: PACU - hemodynamically stable.   Infant Disposition: NICU    Marcy Sirenatherine Eulanda Dorion, D.O. OB Fellow  07/31/2018, 6:58 PM

## 2018-07-31 NOTE — Progress Notes (Signed)
Terb 2.5mg  given SQ left arm.

## 2018-07-31 NOTE — Discharge Summary (Signed)
OB Discharge Summary     Patient Name: Vickie Baldwin DOB: 01/19/1981 MRN: [id number]  Date of admission: 07/10/2018 Delivering MD: Reva BoresPRATT, TANYA S   Date of discharge: 08/03/2018  Admitting diagnosis: 29WKS LEAKING FLUID Intrauterine pregnancy: 4065w4d     Secondary diagnosis:  Principal Problem:   Preterm premature rupture of membranes (PPROM) with unknown onset of labor in third trimester, antepartum Active Problems:   Supervision of high-risk pregnancy   Status post repeat low transverse cesarean section   AMA (advanced maternal age) multigravida 35+   Obesity affecting pregnancy   HSV (herpes simplex virus) anogenital infection   Breech presentation   Vaginal bleeding in pregnancy, third trimester  Additional problems:      Discharge diagnosis: Preterm Pregnancy Delivered  Via c-section                                                                                              Post partum procedures:n/a  Complications: None  Hospital course:  Sceduled C/S   38 y.o. yo G3P2002 at 4565w4d was admitted to the hospital 07/10/2018 for PPROM. She received BTMZ, magnesium. Baby remained breech. She was stable until 07/31/18. On 07/31/18, she began to labor and cervix was dilated to 4 cm. She was taken to OR for  cesarean section with the following indication:Malpresentation. Membrane Rupture Time/Date:   ,07/09/2018   Patient delivered a Viable infant.07/31/2018  Details of operation can be found in separate operative note.  Pateint had an uncomplicated postpartum course.  She is ambulating, tolerating a regular diet, passing flatus, and urinating well. Pain is well controlled. Does report an occasional headache that improves intermittently. Passing gas, no BM yet. Patient is discharged home in stable condition on  08/03/18        Physical exam  Vitals:   08/02/18 1600 08/02/18 1926 08/02/18 2338 08/03/18 0452  BP: (!) 95/53 102/76 (!) 99/52 (!) 105/58  Pulse: 82 (!) 105 86 71  Resp: 18 18  17 18   Temp: 98.2 F (36.8 C) 97.9 F (36.6 C) 98.2 F (36.8 C) 98.2 F (36.8 C)  TempSrc: Oral Oral Oral Oral  SpO2: 100% 100% 100% 100%  Weight:      Height:       BP (!) 105/58 (BP Location: Right Arm)   Pulse 71   Temp 98.2 F (36.8 C) (Oral)   Resp 18   Ht 5\' 2"  (1.575 m)   Wt 92.5 kg   LMP 12/25/2017 (Exact Date)   SpO2 100%   BMI 37.31 kg/m  CONSTITUTIONAL: Well-developed, well-nourished female in no acute distress.  NEUROLOGIC: Alert and oriented to person, place, and time. Normal reflexes, muscle tone coordination. No cranial nerve deficit noted. PSYCHIATRIC: Normal mood and affect. Normal behavior. Normal judgment and thought content. CARDIOVASCULAR: Normal heart rate noted RESPIRATORY: Effort normal, no problems with respiration noted. BREASTS: Symmetric in size. No masses, skin changes, nipple drainage, or lymphadenopathy. ABDOMEN: Soft, no distention noted.  No tenderness, rebound or guarding. Incision with dressing covered, no evidence of infection or active bleeding MUSCULOSKELETAL: Normal range of motion. No tenderness.  No cyanosis,  clubbing, or edema.    Labs: Lab Results  Component Value Date   WBC 13.0 (H) 08/01/2018   HGB 8.0 (L) 08/01/2018   HCT 26.0 (L) 08/01/2018   MCV 77.2 (L) 08/01/2018   PLT 139 (L) 08/01/2018   CMP Latest Ref Rng & Units 08/01/2018  Glucose mg/dL -  BUN 6 - 20 mg/dL -  Creatinine 2.39 - 5.32 mg/dL 0.23  Sodium 343 - 568 mmol/L -  Potassium 3.5 - 5.1 mmol/L -  Chloride 101 - 111 mmol/L -  CO2 22 - 32 mmol/L -  Calcium 8.9 - 10.3 mg/dL -  Total Protein 6.5 - 8.1 g/dL -  Total Bilirubin 0.3 - 1.2 mg/dL -  Alkaline Phos 38 - 616 U/L -  AST 15 - 41 U/L -  ALT 14 - 54 U/L -    Discharge instruction: per After Visit Summary and "Baby and Me Booklet".  After visit meds:  Allergies as of 08/03/2018   No Known Allergies     Medication List    STOP taking these medications   Comfort Fit Maternity Supp Lg Misc    famotidine 20 MG tablet Commonly known as:  PEPCID   lidocaine 5 % ointment Commonly known as:  XYLOCAINE   ondansetron 4 MG disintegrating tablet Commonly known as:  Zofran ODT   promethazine 25 MG tablet Commonly known as:  PHENERGAN   valACYclovir 500 MG tablet Commonly known as:  VALTREX     TAKE these medications   docusate sodium 100 MG capsule Commonly known as:  COLACE Take 1 capsule (100 mg total) by mouth 2 (two) times daily as needed.   ibuprofen 800 MG tablet Commonly known as:  ADVIL Take 1 tablet (800 mg total) by mouth every 6 (six) hours.   oxyCODONE-acetaminophen 5-325 MG tablet Commonly known as:  PERCOCET/ROXICET Take 1-2 tablets by mouth every 4 (four) hours as needed for moderate pain.   Prenate Mini 18-0.6-0.4-350 MG Caps Take 1 tablet by mouth daily.       Diet: routine diet  Activity: Advance as tolerated. Pelvic rest for 6 weeks.   Outpatient follow up:4 weeks Follow up Appt: Future Appointments  Date Time Provider Department Center  08/15/2018  1:45 PM Conan Bowens, MD [facility] None  08/29/2018  1:15 PM Conan Bowens, MD [facility] None   Follow up Visit:No follow-ups on file.   Please schedule this patient for Postpartum visit in: 4 weeks with the following provider: Any provider For C/S patients schedule nurse incision check in weeks 2 weeks: yes High risk pregnancy complicated by: AMA, HSV, PPROM @27  weeks  Delivery mode:  CS Anticipated Birth Control:  other/unsure PP Procedures needed: Incision check  Schedule Integrated BH visit: no  Postpartum contraception: Undecided  Newborn Data: Live born female  Birth Weight: 3 lb 12.7 oz (1720 g) APGAR: ,   Newborn Delivery   Birth date/time:  07/31/2018 18:10:00 Delivery type:      Baby Feeding: Bottle and Breast Disposition:NICU   08/03/2018 Conan Bowens, MD

## 2018-07-31 NOTE — Anesthesia Postprocedure Evaluation (Signed)
Anesthesia Post Note  Patient: Amylia Vangorden  Procedure(s) Performed: CESAREAN SECTION (N/A )     Patient location during evaluation: PACU Anesthesia Type: Spinal Level of consciousness: oriented and awake and alert Pain management: pain level controlled Vital Signs Assessment: post-procedure vital signs reviewed and stable Respiratory status: spontaneous breathing, respiratory function stable and patient connected to nasal cannula oxygen Cardiovascular status: blood pressure returned to baseline and stable Postop Assessment: no headache, no backache and no apparent nausea or vomiting Anesthetic complications: no    Last Vitals:  Vitals:   07/31/18 2000 07/31/18 2015  BP: (!) 90/55   Pulse: 73 71  Resp: 15 13  Temp:    SpO2: 100% 100%    Last Pain:  Vitals:   07/31/18 1945  TempSrc:   PainSc: 0-No pain                 Varetta Chavers

## 2018-07-31 NOTE — Anesthesia Preprocedure Evaluation (Signed)
Anesthesia Evaluation  Patient identified by MRN, date of birth, ID band Patient awake    Reviewed: Allergy & Precautions, H&P , NPO status , Patient's Chart, lab work & pertinent test results, reviewed documented beta blocker date and time   Airway Mallampati: II  TM Distance: >3 FB Neck ROM: full    Dental no notable dental hx.    Pulmonary neg pulmonary ROS,    Pulmonary exam normal breath sounds clear to auscultation       Cardiovascular negative cardio ROS Normal cardiovascular exam Rhythm:regular Rate:Normal     Neuro/Psych negative neurological ROS  negative psych ROS   GI/Hepatic negative GI ROS, Neg liver ROS,   Endo/Other  negative endocrine ROS  Renal/GU negative Renal ROS  negative genitourinary   Musculoskeletal   Abdominal   Peds  Hematology negative hematology ROS (+)   Anesthesia Other Findings   Reproductive/Obstetrics (+) Pregnancy                             Anesthesia Physical Anesthesia Plan  ASA: II and emergent  Anesthesia Plan: Spinal   Post-op Pain Management:    Induction:   PONV Risk Score and Plan: 2  Airway Management Planned: Nasal Cannula  Additional Equipment:   Intra-op Plan:   Post-operative Plan:   Informed Consent: I have reviewed the patients History and Physical, chart, labs and discussed the procedure including the risks, benefits and alternatives for the proposed anesthesia with the patient or authorized representative who has indicated his/her understanding and acceptance.       Plan Discussed with: CRNA, Anesthesiologist and Surgeon  Anesthesia Plan Comments:         Anesthesia Quick Evaluation

## 2018-07-31 NOTE — Progress Notes (Signed)
OB Note Patient with worsened pain now at 8/10. 4cm still breech, fetus category I with accels, toco not picking up any UCs. Will give dose of terb and pt consented with interpreter for urgent c-section.   Wrote for ancef and azithro  Cornelia Copa MD Attending Center for [employer] (Faculty Practice) 07/31/2018 Time: [id number]

## 2018-07-31 NOTE — Transfer of Care (Signed)
Immediate Anesthesia Transfer of Care Note  Patient: Vickie Baldwin  Procedure(s) Performed: CESAREAN SECTION (N/A )  Patient Location: PACU  Anesthesia Type:Spinal  Level of Consciousness: awake, alert  and oriented  Airway & Oxygen Therapy: Patient Spontanous Breathing  Post-op Assessment: Report given to RN and Post -op Vital signs reviewed and stable  Post vital signs: Reviewed and stable HR 89, SaO2 99%, RR 12, BP 96/52  Last Vitals:  Vitals Value Taken Time  BP    Temp    Pulse    Resp    SpO2      Last Pain:  Vitals:   07/31/18 1615  TempSrc: Oral  PainSc: 8       Patients Stated Pain Goal: 3 (07/31/18 0916)  Complications: No apparent anesthesia complications

## 2018-07-31 NOTE — Progress Notes (Signed)
DR Vergie Living called for update on pt   PT c/o of pain place on monitor at 1615  And given  Tylenol   Will continue to observe    And call back if changes

## 2018-08-01 ENCOUNTER — Encounter (HOSPITAL_COMMUNITY): Payer: Self-pay | Admitting: Family Medicine

## 2018-08-01 LAB — CREATININE, SERUM
Creatinine, Ser: 0.49 mg/dL (ref 0.44–1.00)
GFR calc Af Amer: >60 mL/min (ref >60)
GFR calc non Af Amer: >60 mL/min (ref >60)

## 2018-08-01 LAB — CBC
HCT: 26 % — ABNORMAL LOW (ref 36.0–46.0)
Hemoglobin: 8 g/dL — ABNORMAL LOW (ref 12.0–15.0)
MCH: 23.7 pg — ABNORMAL LOW (ref 26.0–34.0)
MCHC: 30.8 g/dL (ref 30.0–36.0)
MCV: 77.2 fL — ABNORMAL LOW (ref 80.0–100.0)
Platelets: 139 10*3/uL — ABNORMAL LOW (ref 150–400)
RBC: 3.37 MIL/uL — ABNORMAL LOW (ref 3.87–5.11)
RDW: 18.9 % — ABNORMAL HIGH (ref 11.5–15.5)
WBC: 13 10*3/uL — ABNORMAL HIGH (ref 4.0–10.5)
nRBC: 0 % (ref 0.0–0.2)

## 2018-08-01 NOTE — Lactation Note (Signed)
This note was copied from a baby's chart. Lactation Consultation Note  Patient Name: Boy Taquila Vanlenten GHWEX'H Date: 08/01/2018 Reason for consult: Initial assessment;NICU baby;Preterm <34wks  Visited with Mom and FOB of preterm infant in the NICU.  Baby 18 hrs old.  Mom has just double pumped and is taking her EBM to NICU for baby.    Reviewed pumping routine, including benefits of hand's on breast massage and hand expression to collect more colostrum for baby. Reviewed importance of disassembling pump parts and washing, rinsing and air drying in separate basin away from sink.  NICU booklet and Lactation brochure given to Mom.  Mom has WIC in [facility], faxed referral to them for pump on discharge, [facility] loaner program explained if Mom discharged over the weekend.    Interventions Interventions: Breast feeding basics reviewed;Skin to skin;Breast massage;Hand express;DEBP  Lactation Tools Discussed/Used Tools: Pump Breast pump type: Double-Electric Breast Pump WIC Program: Yes Pump Review: Setup, frequency, and cleaning;Milk Storage Initiated by:: RN  Date initiated:: 08/01/18   Consult Status Consult Status: Follow-up Date: 07/26/18 Follow-up type: In-patient    Judee Clara 08/01/2018, 12:45 PM

## 2018-08-01 NOTE — Progress Notes (Signed)
Subjective: Postpartum Day 1: Cesarean Delivery Patient reports incisional pain, tolerating PO and + flatus.    Objective: Vital signs in last 24 hours: Temp:  [97.8 F (36.6 C)-98.7 F (37.1 C)] 98.4 F (36.9 C) (05/28 0351) Pulse Rate:  [69-93] 78 (05/28 0537) Resp:  [12-18] 18 (05/28 0351) BP: (89-126)/(50-66) 107/56 (05/28 0537) SpO2:  [95 %-100 %] 98 % (05/28 0351)  Physical Exam:  General: alert, cooperative and appears stated age Lochia: appropriate Uterine Fundus: firm Incision: no significant drainage DVT Evaluation: No evidence of DVT seen on physical exam.  Recent Labs    07/29/18 1050 08/01/18 0524  HGB 9.2* 8.0*  HCT 29.7* 26.0*    Assessment/Plan: Status post Cesarean section. Doing well postoperatively.  Continue current care. Increase ambulation To see baby in NICU D/c tomorrow Lactational support Declines contraception   [name] 08/01/2018, 7:27 AM

## 2018-08-02 LAB — TYPE AND SCREEN
ABO/RH(D): O POS
Antibody Screen: NEGATIVE
Unit division: 0
Unit division: 0
Unit division: 0

## 2018-08-02 LAB — BPAM RBC
Blood Product Expiration Date: 202006222359
Blood Product Expiration Date: 202006232359
Blood Product Expiration Date: 202006232359
Unit Type and Rh: 5100
Unit Type and Rh: 5100
Unit Type and Rh: 5100

## 2018-08-02 MED ORDER — MAGNESIUM HYDROXIDE 400 MG/5ML PO SUSP
30.0000 mL | Freq: Every evening | ORAL | Status: DC | PRN
  Administered 2018-08-02: 30 mL via ORAL
  Filled 2018-08-02: qty 30

## 2018-08-02 MED ORDER — POLYETHYLENE GLYCOL 3350 17 G PO PACK
17.0000 g | PACK | Freq: Every day | ORAL | Status: DC
  Administered 2018-08-02: 17 g via ORAL
  Filled 2018-08-02: qty 1

## 2018-08-02 MED ORDER — POLYSACCHARIDE IRON COMPLEX 150 MG PO CAPS
150.0000 mg | ORAL_CAPSULE | Freq: Every day | ORAL | Status: DC
  Administered 2018-08-02: 10:00:00 150 mg via ORAL
  Filled 2018-08-02: qty 1

## 2018-08-02 NOTE — Progress Notes (Signed)
Daily Antepartum Note  Admission Date: 07/10/2018 Current Date: 08/02/2018 10:03 AM  Ja Mcmurry is a 38 y.o. [id number] HD#24/POD#2 s/p urgent rLTCS @ 30/4 for PTL, breech, admitted for PPROM @ 27/4.  Pregnancy complicated by: Patient Active Problem List   Diagnosis Date Noted  . Vaginal bleeding in pregnancy, third trimester 07/29/2018  . Breech presentation 07/15/2018  . Preterm premature rupture of membranes (PPROM) with unknown onset of labor in third trimester, antepartum 07/10/2018  . HSV (herpes simplex virus) anogenital infection 05/29/2018  . Status post repeat low transverse cesarean section 03/11/2018  . AMA (advanced maternal age) multigravida 35+ 03/11/2018  . Obesity affecting pregnancy 03/11/2018  . Supervision of high-risk pregnancy 02/08/2018    Overnight/24hr events:  none  Subjective:  Meeting all postpartum, post op goals although having some more pain then vs prior c/s when ambulating.   Objective:    Current Vital Signs 24h Vital Sign Ranges  T 98.8 F (37.1 C) Temp  Avg: 98.3 F (36.8 C)  Min: 98.1 F (36.7 C)  Max: 98.8 F (37.1 C)  BP 116/63 BP  Min: 97/52  Max: 116/63  HR 69 Pulse  Avg: 79.1  Min: 69  Max: 88  RR 18 Resp  Avg: 17.4  Min: 16  Max: 18  SaO2 100 % Room Air SpO2  Avg: 99.8 %  Min: 99 %  Max: 100 %       24 Hour I/O Current Shift I/O  Time Ins Outs No intake/output data recorded. No intake/output data recorded.    Physical exam: General: Well nourished, well developed female in no acute distress. Abdomen: obese, +BS, soft, nd, nttp, c/d/i honeycomb dressing Cardiovascular: S1, S2 normal, no murmur, rub or gallop, regular rate and rhythm Respiratory: CTAB Extremities: no clubbing, cyanosis or edema Skin: Warm and dry.   Medications: Current Facility-Administered Medications  Medication Dose Route Frequency Provider Last Rate Last Dose  . coconut oil  1 application Topical PRN Nicolette Bang, DO      . witch  hazel-glycerin (TUCKS) pad 1 application  1 application Topical PRN Nicolette Bang, DO       And  . dibucaine (NUPERCAINAL) 1 % rectal ointment 1 application  1 application Rectal PRN Nicolette Bang, DO      . diphenhydrAMINE (BENADRYL) injection 12.5 mg  12.5 mg Intravenous Q4H PRN Janeece Riggers, MD       Or  . diphenhydrAMINE (BENADRYL) capsule 25 mg  25 mg Oral Q4H PRN Janeece Riggers, MD      . diphenhydrAMINE (BENADRYL) capsule 25 mg  25 mg Oral Q6H PRN Nicolette Bang, DO      . enoxaparin (LOVENOX) injection 40 mg  40 mg Subcutaneous Q24H Nicolette Bang, DO   40 mg at 08/01/18 1932  . gabapentin (NEURONTIN) capsule 100 mg  100 mg Oral BID Nicolette Bang, DO   100 mg at 08/02/18 6606  . ibuprofen (ADVIL) tablet 800 mg  800 mg Oral Q6H Nicolette Bang, DO   800 mg at 08/02/18 0540  . iron polysaccharides (NIFEREX) capsule 150 mg  150 mg Oral Daily Aletha Halim, MD   150 mg at 08/02/18 0942  . measles, mumps & rubella vaccine (MMR) injection 0.5 mL  0.5 mL Subcutaneous Once Nicolette Bang, DO      . menthol-cetylpyridinium (CEPACOL) lozenge 3 mg  1 lozenge Oral Q2H PRN Nicolette Bang, DO      . nalbuphine (NUBAIN) injection  5 mg  5 mg Intravenous Q4H PRN Janeece Riggers, MD       Or  . nalbuphine (NUBAIN) injection 5 mg  5 mg Subcutaneous Q4H PRN Janeece Riggers, MD      . nalbuphine (NUBAIN) injection 5 mg  5 mg Intravenous Once PRN Janeece Riggers, MD       Or  . nalbuphine (NUBAIN) injection 5 mg  5 mg Subcutaneous Once PRN Janeece Riggers, MD      . naloxone [facility]) injection 0.4 mg  0.4 mg Intravenous PRN Janeece Riggers, MD       And  . sodium chloride flush (NS) 0.9 % injection 3 mL  3 mL Intravenous PRN Janeece Riggers, MD      . naloxone HCl (NARCAN) 2 mg in dextrose 5 % 250 mL infusion  1-4 mcg/kg/hr Intravenous Continuous PRN Janeece Riggers, MD      . ondansetron [facility]) injection 4 mg  4 mg  Intravenous Q8H PRN Janeece Riggers, MD      . oxyCODONE-acetaminophen (PERCOCET/ROXICET) 5-325 MG per tablet 1-2 tablet  1-2 tablet Oral Q4H PRN Nicolette Bang, DO   2 tablet at 08/02/18 [phone]  . polyethylene glycol (MIRALAX / GLYCOLAX) packet 17 g  17 g Oral Daily Aletha Halim, MD   17 g at 08/02/18 0942  . prenatal multivitamin tablet 1 tablet  1 tablet Oral Q1200 Nicolette Bang, DO   1 tablet at 08/01/18 1207  . scopolamine (TRANSDERM-SCOP) 1 MG/3DAYS 1.5 mg  1 patch Transdermal Once Janeece Riggers, MD      . simethicone [facility]) chewable tablet 80 mg  80 mg Oral TID PC Nicolette Bang, DO   80 mg at 08/02/18 1165  . Tdap (BOOSTRIX) injection 0.5 mL  0.5 mL Intramuscular Once Nicolette Bang, DO      . zolpidem (AMBIEN) tablet 5 mg  5 mg Oral QHS PRN Nicolette Bang, DO        Labs:  Recent Labs  Lab 07/29/18 1050 08/01/18 0524  WBC 7.0 13.0*  HGB 9.2* 8.0*  HCT 29.7* 26.0*  PLT 152 139*    Recent Labs  Lab 08/01/18 0524  CREATININE 0.49     Radiology:  No new imaging  Assessment & Plan:  Pt doing well *PP, post op: doing well. Would like to stay the full 3 days. O POS. Boy-ask about circ tomorrow.  *Anemia: continue iron. No s/s of anemia.  *AMA: no issues *PPx:  SCDs, OOB ad lib *FEN/GI: regular diet, SLIV *Dispo: tomorrow  Phone interpreter used  Aletha Halim, Brooke Bonito. MD Attending Center for [employer] Fish farm manager)

## 2018-08-02 NOTE — Lactation Note (Signed)
This note was copied from a baby's chart. Lactation Consultation Note  Patient Name: Vickie Baldwin Date: 08/02/2018 Reason for consult: Follow-up assessment;NICU baby  2119 - 2126 - I visited [name] to check on her progress with breast pumping. She states that she is pumping every three hours for 15 minutes a pump. She states she does not pump much milk.  I reviewed expected pumping output in days 1-3 and educated on when to expect milk to transition and milk volume to increase. I encouraged mom to continue to pump 8 times a day, including at night. Mom and dad verbalized understanding.  Mom and dad asked about how to obtain a [facility] loaner pump. She hopes to be discharged tomorrow. They have a [facility] appointment scheduled for this Tuesday June 2. I reviewed the process and recommended that we could bring one to her at our discharge visit tomorrow. Mom and dad verbalized understanding.   Feeding Feeding Type: Donor Breast Milk  Interventions Interventions: Breast feeding basics reviewed  Lactation Tools Discussed/Used WIC Program: Yes Pump Review: Setup, frequency, and cleaning   Consult Status Consult Status: Follow-up Date: 08/03/18 Follow-up type: In-patient    Walker Shadow 08/02/2018, 9:29 PM

## 2018-08-03 MED ORDER — DOCUSATE SODIUM 100 MG PO CAPS: 100.0000 mg | ORAL_CAPSULE | Freq: Two times a day (BID) | ORAL | 2 refills | Status: DC | PRN

## 2018-08-03 MED ORDER — OXYCODONE-ACETAMINOPHEN 5-325 MG PO TABS: 1.0000 | ORAL_TABLET | ORAL | 0 refills | Status: DC | PRN

## 2018-08-03 MED ORDER — IBUPROFEN 800 MG PO TABS: 800.0000 mg | ORAL_TABLET | Freq: Four times a day (QID) | ORAL | 0 refills | Status: DC

## 2018-08-03 NOTE — Discharge Instructions (Signed)

## 2018-08-03 NOTE — Lactation Note (Signed)
This note was copied from a baby's chart. Lactation Consultation Note  Patient Name: Boy Thelma Estepp [id number] Date: 08/03/2018 Reason for consult: Follow-up assessment;NICU baby Pecola Leisure is 63 hours in the NICU.  Mom is pumping every 2-3 hours and last pumped 5 mls.  Mom denies questions or concerns.  Mom is interested in a [facility] loaner.  Paperwork left.  Maternal Data    Feeding    LATCH Score                   Interventions    Lactation Tools Discussed/Used     Consult Status Consult Status: Complete Follow-up type: Call as needed    Huston Foley 08/03/2018, 9:28 AM

## 2018-08-06 ENCOUNTER — Encounter (HOSPITAL_COMMUNITY): Payer: Self-pay

## 2018-08-06 ENCOUNTER — Ambulatory Visit (HOSPITAL_COMMUNITY): Admission: RE | Admit: 2018-08-06 | Payer: BLUE CROSS/BLUE SHIELD | Source: Ambulatory Visit

## 2018-08-15 ENCOUNTER — Other Ambulatory Visit: Payer: Self-pay

## 2018-08-15 ENCOUNTER — Encounter: Payer: Self-pay | Admitting: Obstetrics and Gynecology

## 2018-08-15 ENCOUNTER — Ambulatory Visit (INDEPENDENT_AMBULATORY_CARE_PROVIDER_SITE_OTHER): Payer: BLUE CROSS/BLUE SHIELD | Admitting: Obstetrics and Gynecology

## 2018-08-15 VITALS — BP 108/70 | HR 96 | Ht 62.0 in | Wt 205.0 lb

## 2018-08-15 DIAGNOSIS — Z8759 Personal history of other complications of pregnancy, childbirth and the puerperium: Secondary | ICD-10-CM

## 2018-08-15 DIAGNOSIS — O321XX0 Maternal care for breech presentation, not applicable or unspecified: Secondary | ICD-10-CM

## 2018-08-15 DIAGNOSIS — Z9889 Other specified postprocedural states: Secondary | ICD-10-CM

## 2018-08-15 MED ORDER — MISC. DEVICES MISC: 0 refills | Status: DC

## 2018-08-15 NOTE — Progress Notes (Signed)
    Post Cesarean Section Incision Check  Vickie Baldwin is a 38 y.o. Asian [id number] (Patient's last menstrual period was 12/25/2017 (exact date).) who is s/p RCS on 07/31/18 at 27 weeks for breech, PPROM and labor. She was discharged to home on POD#3. Pregnancy complicated by PPROM, breech position, HSV1 outbreak.  Complains of nothing, overall she is feeling well.  Physical Exam:  BP 108/70   Pulse 96   Ht 5\' 2"  (1.575 m)   Wt 205 lb (93 kg)   LMP 12/25/2017 (Exact Date)   Breastfeeding No   BMI 37.49 kg/m  Body mass index is 37.49 kg/m. General appearance: Well nourished, well developed female in no acute distress.  Abdomen: soft, appropriately tender, incision clean, dry, intact with no signs of active bleeding or infection, steri-strips removed  Assessment: Patient is a 38 y.o. [id number] who is 2 weeks post partum from a repeat c-section. She is doing well. Incision appears to be healing appropriately   Plan:  Follow up 2 weeks for post partum visit and paraguard IUD insertion   K. Arvilla Meres, M.D. Attending Center for [employer] Fish farm manager)

## 2018-08-15 NOTE — Progress Notes (Signed)
2 weeks PP incision check, reports no problems today.

## 2018-08-29 ENCOUNTER — Ambulatory Visit (INDEPENDENT_AMBULATORY_CARE_PROVIDER_SITE_OTHER): Payer: BLUE CROSS/BLUE SHIELD | Admitting: Obstetrics and Gynecology

## 2018-08-29 ENCOUNTER — Other Ambulatory Visit: Payer: Self-pay

## 2018-08-29 ENCOUNTER — Encounter: Payer: Self-pay | Admitting: Obstetrics and Gynecology

## 2018-08-29 VITALS — BP 128/78 | HR 83 | Wt 203.5 lb

## 2018-08-29 DIAGNOSIS — Z789 Other specified health status: Secondary | ICD-10-CM

## 2018-08-29 DIAGNOSIS — Z758 Other problems related to medical facilities and other health care: Secondary | ICD-10-CM

## 2018-08-29 DIAGNOSIS — Z30430 Encounter for insertion of intrauterine contraceptive device: Secondary | ICD-10-CM | POA: Diagnosis not present

## 2018-08-29 DIAGNOSIS — Z9889 Other specified postprocedural states: Secondary | ICD-10-CM

## 2018-08-29 DIAGNOSIS — A609 Anogenital herpesviral infection, unspecified: Secondary | ICD-10-CM

## 2018-08-29 DIAGNOSIS — Z3202 Encounter for pregnancy test, result negative: Secondary | ICD-10-CM | POA: Diagnosis not present

## 2018-08-29 LAB — POCT URINE PREGNANCY: Preg Test, Ur: NEGATIVE

## 2018-08-29 MED ORDER — PARAGARD INTRAUTERINE COPPER IU IUD
INTRAUTERINE_SYSTEM | Freq: Once | INTRAUTERINE | Status: AC
  Administered 2018-08-29: 14:00:00 via INTRAUTERINE

## 2018-08-29 NOTE — Progress Notes (Signed)
Obstetrics/Postpartum Visit  Appointment Date: 08/29/2018  OBGYN Clinic: [facility]  Primary Care Provider: Patient, No Pcp Per  Chief Complaint:  Chief Complaint  Patient presents with  . Postpartum Care    History of Present Illness: Zayden Dirusso is a 38 y.o. Asian [id number] (Patient's last menstrual period was 12/25/2017 (exact date).), seen for the above chief complaint. Her past medical history is significant for n/a.  She is s/p RCS on 07/31/18 at 30 weeks 2/2 PPROM @ 27 weeks, breech in labor; she was discharged to home on POD#3. 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  Complains of nothing, feeling well. Sore but overall doing very well.  Vaginal bleeding or discharge: No  Breast or formula feeding: breast Intercourse: No  Contraception: Paraguard IUD today PP depression s/s: No  Any bowel or bladder issues: No  Pap smear: no abnormalities (date: 03/2018)  Review of Systems: Positive for n/a.   Her 12 point review of systems is negative or as noted in the History of Present Illness.  Patient Active Problem List   Diagnosis Date Noted  . Vaginal bleeding in pregnancy, third trimester 07/29/2018  . Breech presentation 07/15/2018  . Preterm premature rupture of membranes (PPROM) with unknown onset of labor in third trimester, antepartum 07/10/2018  . HSV (herpes simplex virus) anogenital infection 05/29/2018  . Status post repeat low transverse cesarean section 03/11/2018  . AMA (advanced maternal age) multigravida 35+ 03/11/2018  . Obesity affecting pregnancy 03/11/2018  . Supervision of high-risk pregnancy 02/08/2018    Medications Marrietta Wakeman had no medications administered during this visit. Current Outpatient Medications  Medication Sig Dispense Refill  . Prenat-FeCbn-FeAsp-Meth-FA-DHA (PRENATE MINI) 18-0.6-0.4-350 MG CAPS Take 1 tablet by mouth daily. 30 capsule 12  . docusate sodium (COLACE) 100 MG capsule Take 1 capsule (100 mg total) by mouth  2 (two) times daily as needed. (Patient not taking: Reported on 08/29/2018) 30 capsule 2  . ibuprofen (ADVIL) 800 MG tablet Take 1 tablet (800 mg total) by mouth every 6 (six) hours. (Patient not taking: Reported on 08/29/2018) 30 tablet 0  . Misc. Devices MISC Dispense one maternity belt for patient (Patient not taking: Reported on 08/29/2018) 1 each 0  . oxyCODONE-acetaminophen (PERCOCET/ROXICET) 5-325 MG tablet Take 1-2 tablets by mouth every 4 (four) hours as needed for moderate pain. (Patient not taking: Reported on 08/29/2018) 30 tablet 0   Current Facility-Administered Medications  Medication Dose Route Frequency Provider Last Rate Last Dose  . paragard intrauterine copper IUD   Intrauterine Once Sloan Leiter, MD        Allergies Patient has no known allergies.  Physical Exam:  BP 128/78   Pulse 83   Wt 203 lb 8 oz (92.3 kg)   LMP 12/25/2017 (Exact Date)   BMI 37.22 kg/m  Body mass index is 37.22 kg/m. General appearance: Well nourished, well developed female in no acute distress.  Cardiovascular: regular rate and rhythm Respiratory:  Normal respiratory effort Abdomen:  no masses, hernias; diffusely non tender to palpation, non distended, well healed pfannenstiel incision Breasts: not examined. Neuro/Psych:  Normal mood and affect.  Skin:  Warm and dry.   Pelvic exam: is not limited by body habitus EGBUS: within normal limits Vagina: within normal limits and with None blood in the vault, Cervix:  no lesions or cervical motion tenderness Uterus:  nonenlarged  PP Depression Screening:   Edinburgh Postnatal Depression Scale - 08/29/18 1320      Edinburgh Postnatal Depression Scale:  In the Past 7 Days   I have been able to laugh and see the funny side of things.  2    I have looked forward with enjoyment to things.  0    I have blamed myself unnecessarily when things went wrong.  1    I have been anxious or worried for no good reason.  0    I have felt scared or panicky  for no good reason.  0    Things have been getting on top of me.  0    I have been so unhappy that I have had difficulty sleeping.  0    I have felt sad or miserable.  0    I have been so unhappy that I have been crying.  0    The thought of harming myself has occurred to me.  0    Edinburgh Postnatal Depression Scale Total  3       Assessment: Patient is a 38 y.o. [id number] who is 4 weeks post partum from a repeat c-section for breech in labor with PPROM at 6393w4d. IUD insertion today. She is doing well.   Plan:   1. Encounter for initial insertion of intrauterine contraceptive device Please see note for insertion. - POCT urine pregnancy - paragard intrauterine copper IUD  2. Postpartum care and examination Doing well  3. Postoperative state Doing well  4. HSV (herpes simplex virus) anogenital infection  5. Language barrier Arabic interpretor used  RTC 4 weeks   K. Therese SarahMeryl , M.D. Attending Center for [employer] [facility] Midwife(Faculty Practice)

## 2018-08-29 NOTE — Progress Notes (Signed)
    IUD INSERTION PROCEDURE NOTE  Vickie Baldwin is a 38 y.o. [id number] here for Paraguard insertion. No GYN concerns.   She was counseled regarding the risks/benefits of IUD including insertion risk of infection, hemorrhage, damage to surrounding tissue and organs, uterine perforation. She was counseled regarding risks of IUD including implantation into uterine wall, migration outside of uterus, possible need for hysteroscopic or laparoscopic removal, expulsion. She was advised that risk of pregnancy is low with negative UPT but is not zero and IUD insertion may cause miscarriage. Reviewed that she is also at slightly higher risk for ectopic pregnancy and she should take a pregnancy test if she believes she may be pregnant. She was advised to use backup method of protection for one week. She verbalized understanding of all of the above and consent signed.   Last intercourse was prior to delivery on 07/31/18 Last pap smear was on 03/2018 and was negative UPT today: negative  IUD Insertion  Patient identified and an adequate time out was performed. Speculum placed in the vagina. The cervix was cleaned with Betadine x 2 and grasped anteriorly with a single tooth tenaculum.  A uterine sound was used to sound the uterus to 9 cm;  the IUD was then placed per manufacturer's recommendations. Strings trimmed to 3 cm. Tenaculum was removed, good hemostasis noted with pressure and silver nitrate. Patient tolerated procedure well.   Patient was given post-procedure instructions.  She was reminded to have backup contraception for one week during this transition period between IUDs.  Patient was also asked to check IUD strings periodically and follow up in 4 weeks for IUD check.  Paraguard IUD Exp: 03/2024 Lot: [id number]  Arabic interpretor used for consent and interview.  Feliz Beam, M.D. Attending Center for [employer] Fish farm manager)

## 2018-09-03 ENCOUNTER — Ambulatory Visit: Payer: Self-pay

## 2018-09-03 NOTE — Lactation Note (Signed)
This note was copied from a baby's chart. Lactation Consultation Note  Patient Name: Vickie Baldwin [id number] Date: 09/03/2018 Reason for consult: Follow-up assessment;NICU baby;Preterm <34wks;Mother's request  Used [employer] interpreter services for Arabic, interpreter [place]at # [phone]  Visited with mom of a 4 w.o pre-term NICU female, he's finally above 6 lbs and mom has started taking him to the breast, first LATCH score recorded was yesterday. Mom called for assistance for the 8 pm feeding. RN checked baby's diaper and took her vitals before starting feeding at the breast.  LC took baby to mother's left breast in cross cradle position first and he was able to latch after a few attempts, LC had to "sandwich" the breast for baby to grasp it and he required constant stimulation. He fed for about 9 minutes, before switching to football hold the last 3 minutes of the feeding. Loud audible swallows noted throughout the entire feeding but baby still needed continuous stimulation in order to sustain the latch.  Mom was very excited, praised for her efforts but also let her know that this was atypical behavior and to not give up if baby doesn't perform as well on posterior feedings. Baby still have some reflux issues and getting tired, he self released from the breast at the 12 minutes mark (combined), mom burping baby when exiting the room.  She was very engaged in [facility] consultation and had lots of questions. Discussed galactagogues, allergies, STS benefits, pumping and normal premature behavior. RN adjusted the amount of gavage feeding since baby nursed at the breast between 10-15 minutes, and let mom know through interpreter. Encouraged mom to keep putting baby to the breast, to watch for cues and/or scheduled feedings but not to force him to BF if he's getting tired or he starts choking/coughing.  Feeding plan:  1. Encouraged mom to start putting baby to the breast, but not to exceed 30 minutes feedings   2. She'll continue pumping every 3 hours and labeling her milk for her NICU baby 3. NICU staff to continue with gavage feedings as BF progresses, but will keep monitoring weight gain  Mom reported all questions and concerns were answered, she's aware of [place] services and will call PRN.  Maternal Data    Feeding Feeding Type: Breast Fed  LATCH Score Latch: Repeated attempts needed to sustain latch, nipple held in mouth throughout feeding, stimulation needed to elicit sucking reflex.  Audible Swallowing: Spontaneous and intermittent  Type of Nipple: Everted at rest and after stimulation  Comfort (Breast/Nipple): Soft / non-tender  Hold (Positioning): Assistance needed to correctly position infant at breast and maintain latch.  LATCH Score: 8  Interventions Interventions: Breast feeding basics reviewed;Assisted with latch;Breast massage;Hand express;Breast compression;Adjust position;Position options;Support pillows  Lactation Tools Discussed/Used     Consult Status Consult Status: PRN Follow-up type: In-patient    Vickie Baldwin 09/03/2018, 8:39 PM

## 2018-09-20 ENCOUNTER — Ambulatory Visit: Payer: Self-pay

## 2018-09-20 NOTE — Lactation Note (Signed)
This note was copied from a baby's chart. Lactation Consultation Note  Patient Name: Vickie Baldwin [id number] Date: 09/20/2018 Reason for consult: Follow-up assessment;Mother's request;NICU baby;Preterm <34wks   Used Youth worker for Arabic interpreter # [phone]  Visited with mom of a 7 week old infant, mom hasn't been taking baby to the breast consistently since last [place] visit two weeks ago. Mom is still pumping every 3 hours and getting about 120-140 ml per pumping session. She requested assistance with the feedings, LC took baby STS and helped latching him to the left breast and baby took it well after a few attempts.  He kept pushing away from the breast though and required constant repositioning but he did not fall asleep or shown signs of fatigue; he was in his quiet alert state and cueing at the breast. Baby able to latch for about 5 minutes at the bare breast, but after constant repositioning, LC tried a # 20 NS and baby was able to complete a 16 minutes feeding with multiple audible swallows noted. Mom was very pleased, she told LC that she's coming twice a day to the hospital and that she would be able to take baby to the breast on a daily basis.  Asked mom to check with NICU staff before doing STS or taking baby to the breast to make sure his vitals are stable. Mom had lots of questions, reviewed breastmilk storage guidelines, supplemental feedings at the breast, NS cleaning, instructions and storage and burping techniques due to baby's medical condition, he has reflux.   LC to come back later today to give mom another NS # 20; her RN threw it in the trash. Only one LC available on this shift, no back up to restock on NS at this time, mom wants to practice how to use her NS.  Feeding plan:  1. Encouraged mom to start putting baby to the breast whenever he's cueing, but not to exceed 30 minutes feedings. Use NS # 20 PRN  2. She'll continue pumping every 3 hours and  labeling her milk for her NICU baby 3. NICU staff to continue with gavage and bottle feedings as BF progresses, but will keep monitoring weight gain  Mom reported all questions and concerns were answered, she's aware of [place] services and will call PRN.  Maternal Data    Feeding Feeding Type: Breast Fed  LATCH Score Latch: Repeated attempts needed to sustain latch, nipple held in mouth throughout feeding, stimulation needed to elicit sucking reflex.  Audible Swallowing: Spontaneous and intermittent  Type of Nipple: Everted at rest and after stimulation  Comfort (Breast/Nipple): Soft / non-tender  Hold (Positioning): Assistance needed to correctly position infant at breast and maintain latch.  LATCH Score: 8  Interventions Interventions: Breast feeding basics reviewed;Assisted with latch;Skin to skin;Breast massage;Hand express;Breast compression;Adjust position;Support pillows  Lactation Tools Discussed/Used Tools: Nipple Shields Nipple shield size: 20   Consult Status Consult Status: Follow-up Date: 09/20/18 Follow-up type: In-patient    Vickie Baldwin 09/20/2018, 6:13 PM

## 2018-09-20 NOTE — Lactation Note (Signed)
This note was copied from a baby's chart. Lactation Consultation Note  Patient Name: Vickie Baldwin [id number] Date: 09/20/2018 Reason for consult: Follow-up assessment;Mother's request;NICU baby;Difficult latch;Preterm <34wks  Used [employer] interpreter services for Arabic, Maisa, interpreter # [phone]  [facility] visited with mom again to bring her a new NS # 28 and showed her how to use it correctly. Mom able to demonstrate and teach back to [facility]. She had no further questions or concerns at this point and told LC she'll call again the next time she needed assistance with BF, but feeling much more confident now. LC to follow up with mom PRN whenever she calls again.  Maternal Data    Feeding Feeding Type: Breast Milk  LATCH Score Latch: Repeated attempts needed to sustain latch, nipple held in mouth throughout feeding, stimulation needed to elicit sucking reflex.  Audible Swallowing: Spontaneous and intermittent  Type of Nipple: Everted at rest and after stimulation  Comfort (Breast/Nipple): Soft / non-tender  Hold (Positioning): Assistance needed to correctly position infant at breast and maintain latch.  LATCH Score: 8  Interventions Interventions: Breast feeding basics reviewed  Lactation Tools Discussed/Used Tools: Nipple Shields Nipple shield size: 20   Consult Status Consult Status: PRN Date: 09/20/18 Follow-up type: In-patient    [name] 09/20/2018, 8:50 PM

## 2018-09-26 ENCOUNTER — Encounter: Payer: Self-pay | Admitting: Certified Nurse Midwife

## 2018-09-26 ENCOUNTER — Ambulatory Visit (INDEPENDENT_AMBULATORY_CARE_PROVIDER_SITE_OTHER): Payer: BLUE CROSS/BLUE SHIELD | Admitting: Certified Nurse Midwife

## 2018-09-26 VITALS — BP 115/75 | HR 90 | Ht 62.0 in | Wt 208.2 lb

## 2018-09-26 DIAGNOSIS — Z975 Presence of (intrauterine) contraceptive device: Secondary | ICD-10-CM

## 2018-09-26 DIAGNOSIS — Z30431 Encounter for routine checking of intrauterine contraceptive device: Secondary | ICD-10-CM

## 2018-09-26 NOTE — Progress Notes (Signed)
Pt presents for IUD string check. Pt states that she has no issues with the IUD, and her partner does not complain of any issues either.

## 2018-09-26 NOTE — Progress Notes (Signed)
GYNECOLOGY OFFICE VISIT NOTE  History:  38 y.o. [id number] here today for IUD string check. Paragard IUD was placed on 6/25. She denies any abnormal vaginal discharge, pelvic pain or other concerns. Having no spotting/bleeding. No dyspareunia.   Past Medical History:  Diagnosis Date  . Supervision of high-risk pregnancy 02/08/2018   BabyRx 3/30--BP cuff given in office Nursing Staff Provider Office Location  [facility] Dating  LMP Language   ARABIC Anatomy US   incomplete Flu Vaccine  03/11/18 Genetic Screen  NIPS: low risks     AFP:  neg TDaP vaccine   06/03/2018 Hgb A1C or  GTT Early  Third trimester  Rhogam     LAB RESULTS  Feeding Plan  breastfeed/bottle Blood Type O/Positive/-- (01/06 1129)  Contraception  none Antibody Neg    Past Surgical History:  Procedure Laterality Date  . CESAREAN SECTION    . CESAREAN SECTION N/A 07/31/2018   Procedure: CESAREAN SECTION;  Surgeon: Donnamae Jude, MD;  Location: [facility] LD ORS;  Service: Obstetrics;  Laterality: N/A;  . TONSILLECTOMY      The following portions of the patient's history were reviewed and updated as appropriate: allergies, current medications, past family history, past medical history, past social history, past surgical history and problem list.   Review of Systems:  Pertinent items noted in HPI and remainder of comprehensive ROS otherwise negative.  Objective:  Physical Exam BP 115/75   Pulse 90   Ht 5\' 2"  (1.575 m)   Wt 208 lb 3.2 oz (94.4 kg)   LMP 12/25/2017 (Exact Date)   BMI 38.08 kg/m  CONSTITUTIONAL: Well-developed, well-nourished female in no acute distress.  HENT:  Normocephalic, atraumatic.  SKIN: Skin is warm and dry. No rash noted. Not diaphoretic. No erythema. No pallor. NEUROLOGIC: Alert and oriented to person, place, and time.  PSYCHIATRIC: Normal mood and affect. Normal behavior. Normal judgment and thought content. CARDIOVASCULAR: Normal heart rate noted RESPIRATORY: Effort and rate normal ABDOMEN: Soft, no  distention noted.   PELVIC: Normal appearing external genitalia; normal appearing vaginal mucosa and cervix. IUD string seen. No abnormal discharge noted.    Assessment & Plan:  1. IUD (intrauterine device) in place - Paragard IUD in place, no complaints - Patient reports having IUD before and recurrent yeast infections occurring with that one, denies s/s of yeast infection currently  - Encouraged patient to call if she has s/s of yeast infection and we will send her medication for tx, patient verbalizes understanding   Please refer to After Visit Summary for other counseling recommendations.   Return in about 1 year (around 09/26/2019) for WELL WOMAN VISIT.   Lajean Manes, CNM 09/26/2018 2:00 PM

## 2019-06-25 IMAGING — US US ABDOMEN LIMITED
1 series · 14 of 25 positions shown · non-contrast
Comparison: None.

CLINICAL DATA: Right upper quadrant pain and nausea since 2 a.m..

EXAM:
ULTRASOUND ABDOMEN LIMITED RIGHT UPPER QUADRANT

[Series 1: us abdomen limited · 0.22mm/px · 14 of 37 slices shown]
[im 1/37]
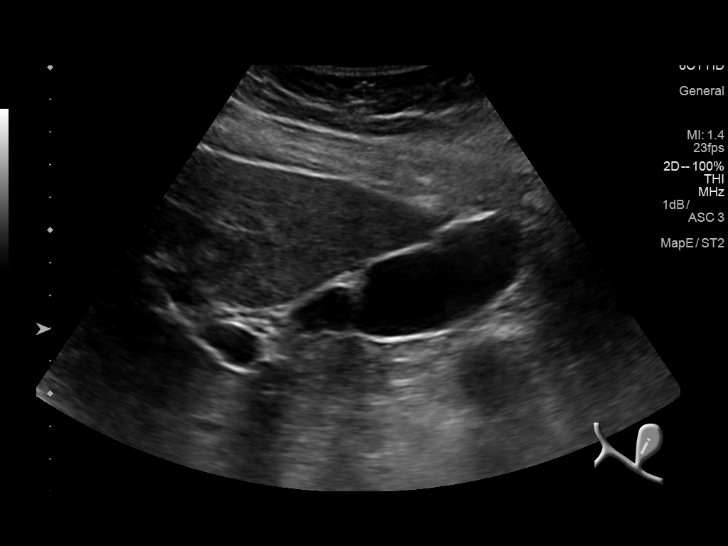
[im 4/37]
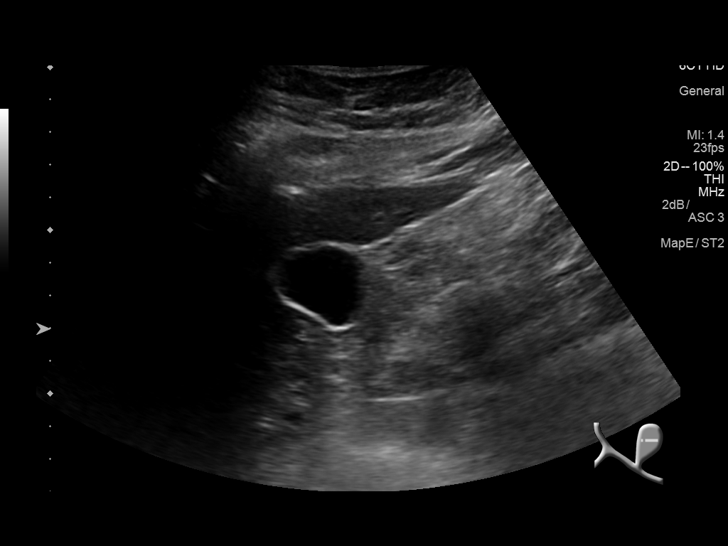
[im 7/37]
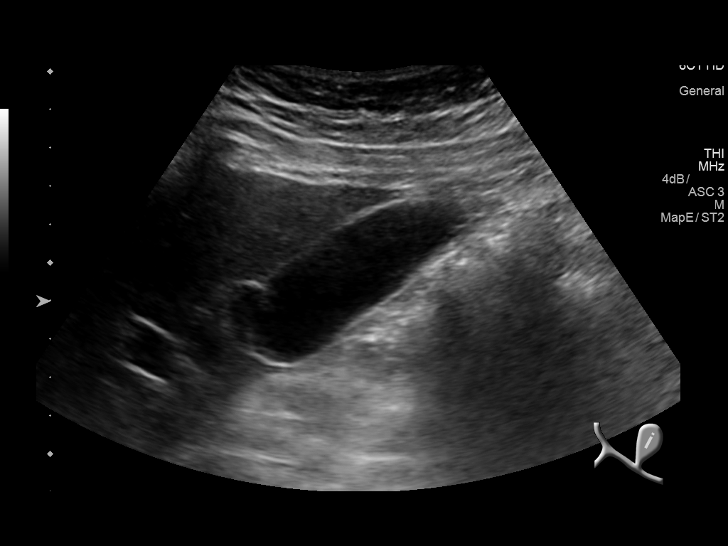
[im 10/37]
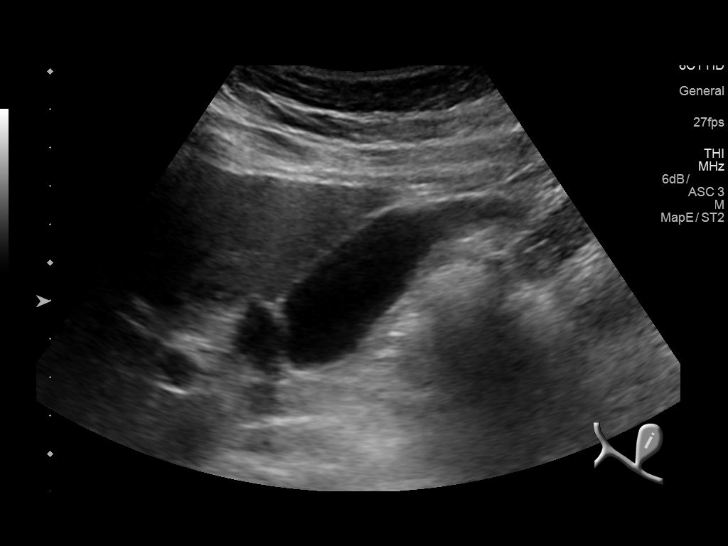
[im 13/37]
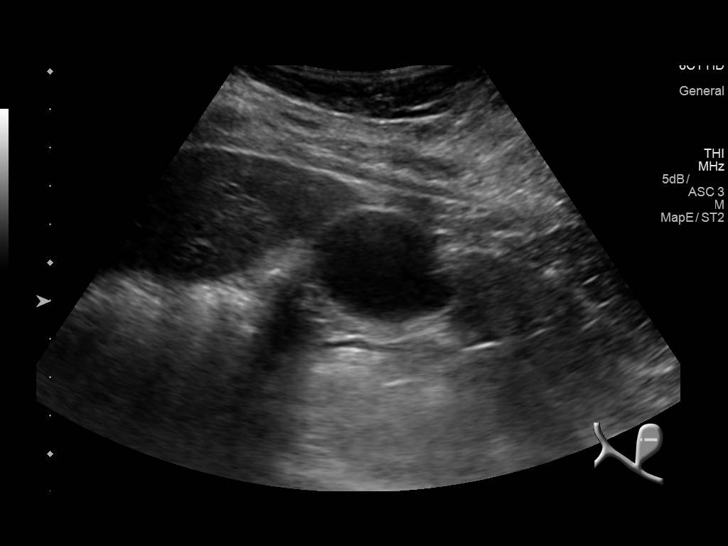
[im 14/37]
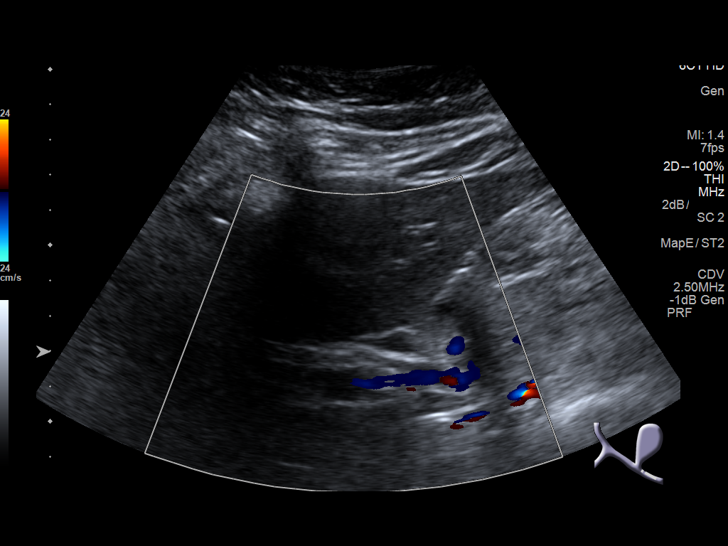
[im 17/37]
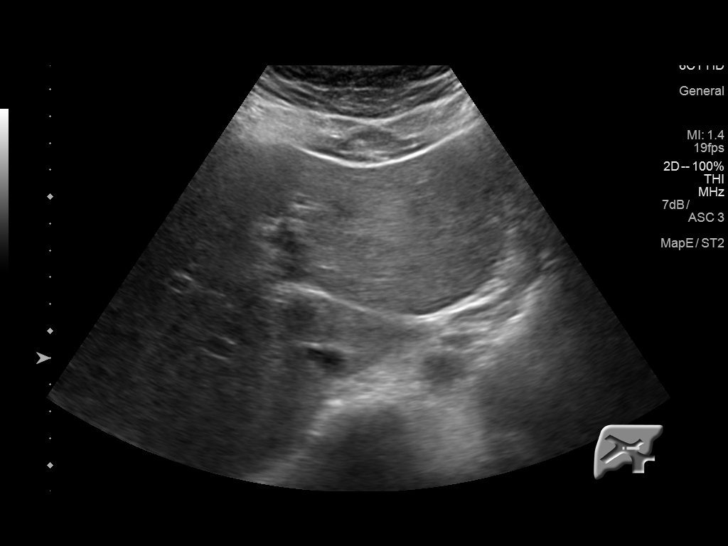
[im 20/37]
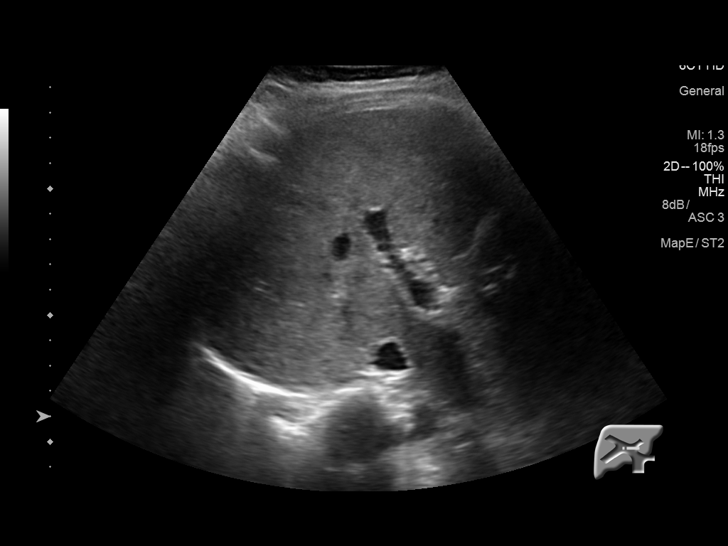
[im 23/37]
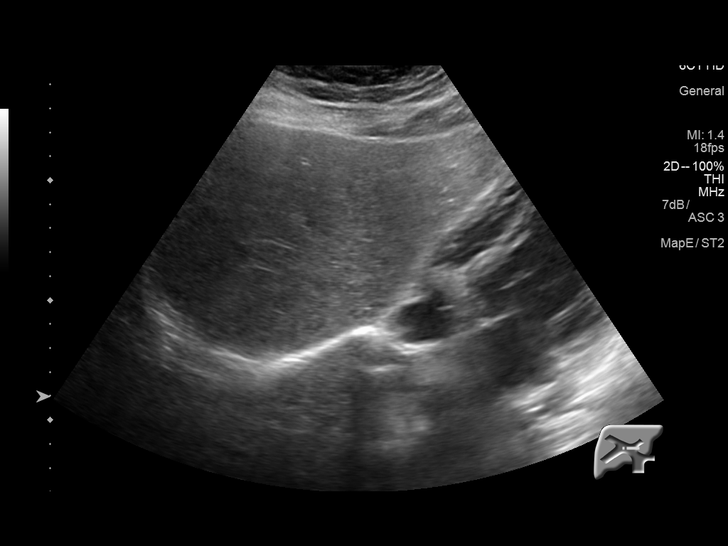
[im 25/37]
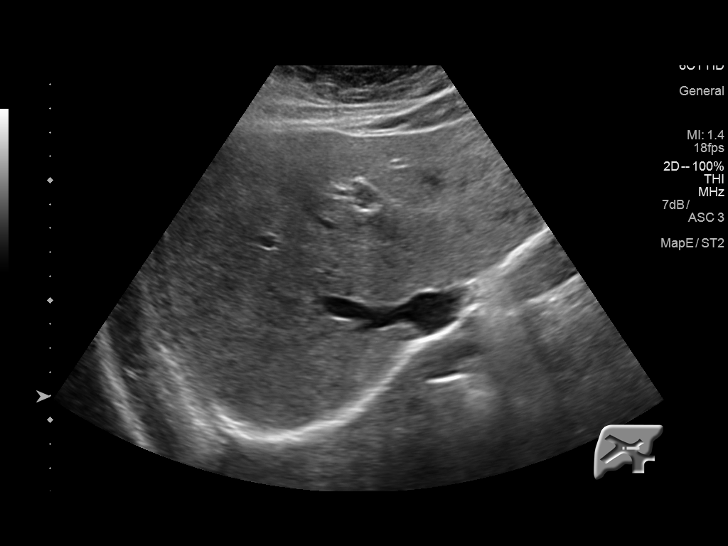
[im 28/37]
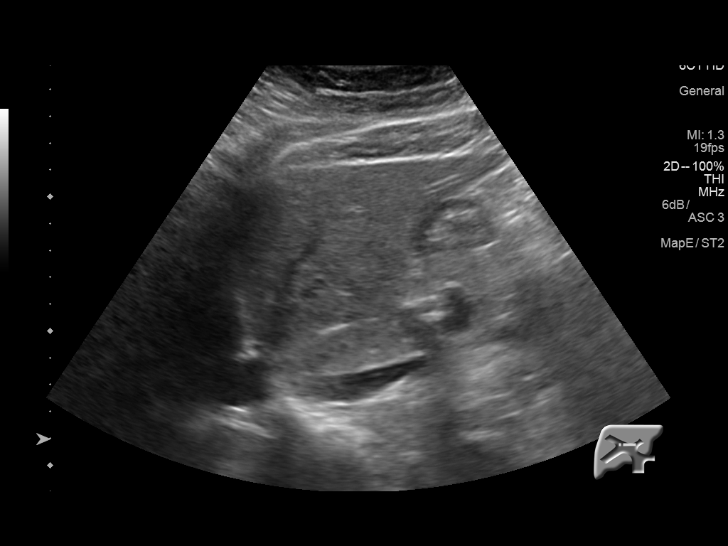
[im 31/37]
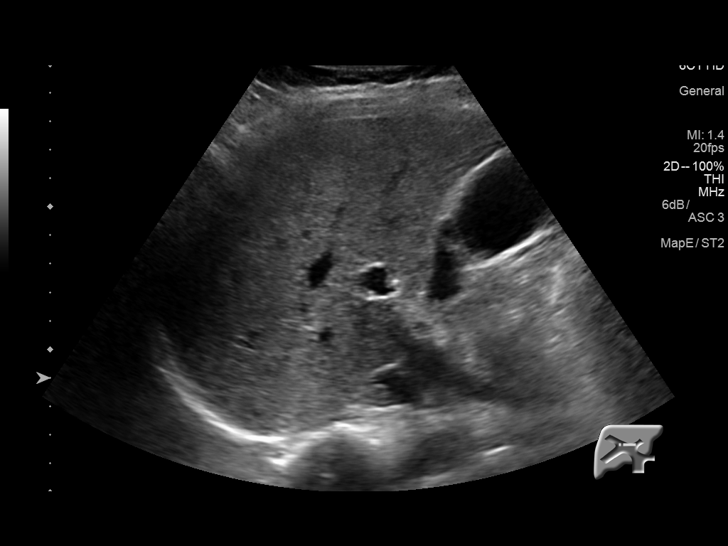
[im 34/37]
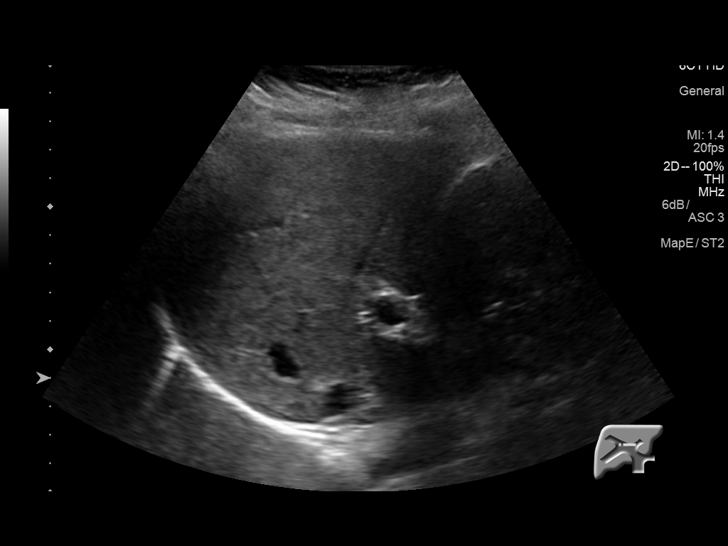
[im 37/37]
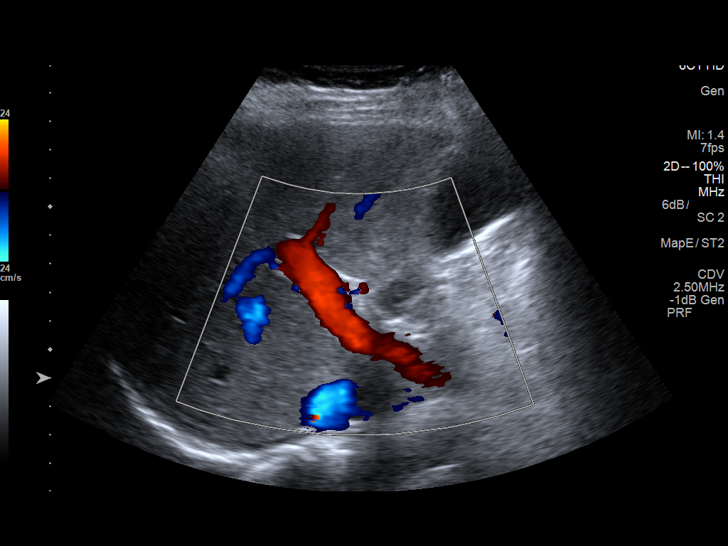

[14 of 25 positions shown; findings below may reference images not displayed]

FINDINGS: Gallbladder:

No gallstones or wall thickening visualized. No sonographic Murphy
sign noted by sonographer.

Common bile duct:

Diameter: 3 mm, normal.

Liver:

No focal lesion identified. Mildly increased in parenchymal
echogenicity. Portal vein is patent on color Doppler imaging with
normal direction of blood flow towards the liver.
IMPRESSION: 1. No acute abnormality.
2. Mildly increased hepatic parenchymal echogenicity, as can be seen
with steatosis.

## 2020-03-21 IMAGING — US US MFM OB DETAIL+14 WK
1 series · 13 of 28 positions shown · non-contrast
Comparison: none

[Series 1: us mfm ob detail+14 wk · 13 of 91 slices shown]
[im 4/91]
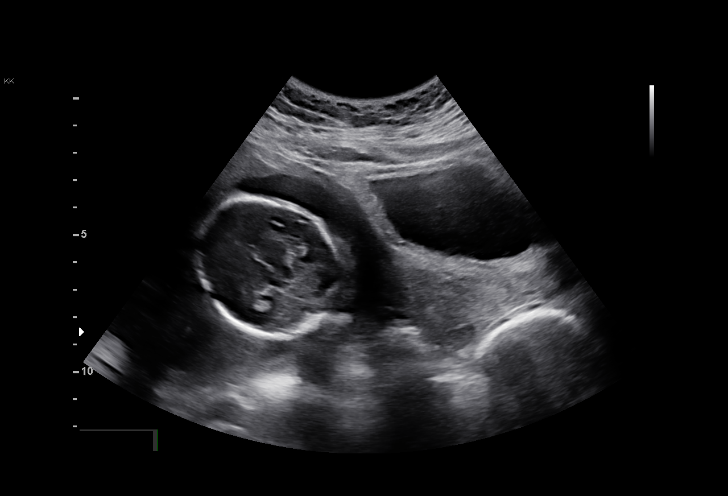
[im 11/91]
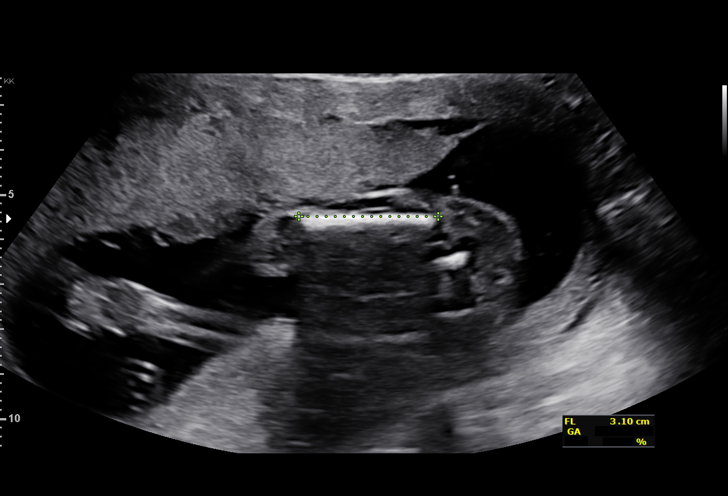
[im 17/91]
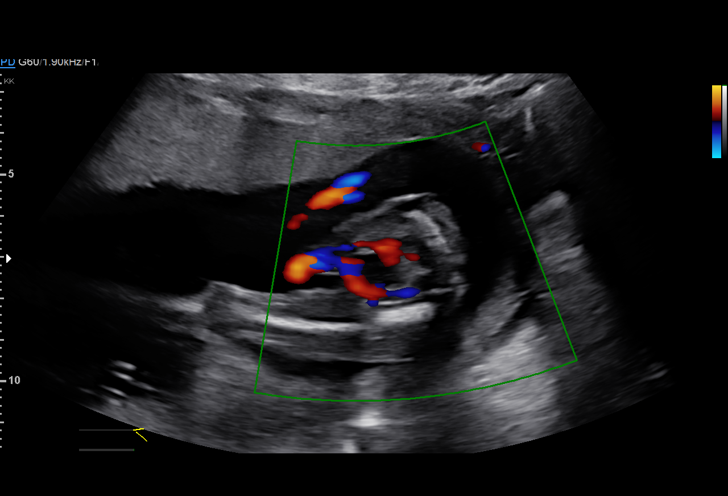
[im 24/91]
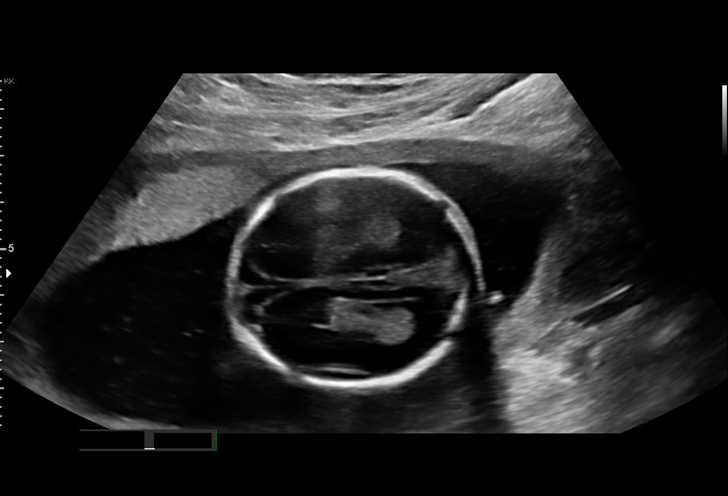
[im 31/91]
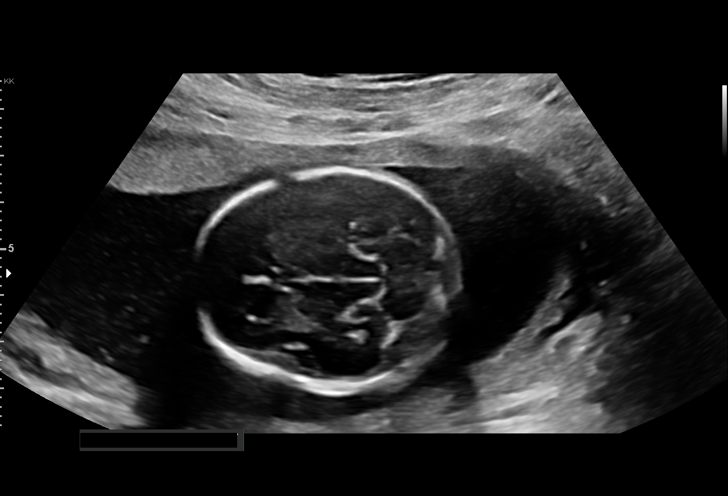
[im 37/91]
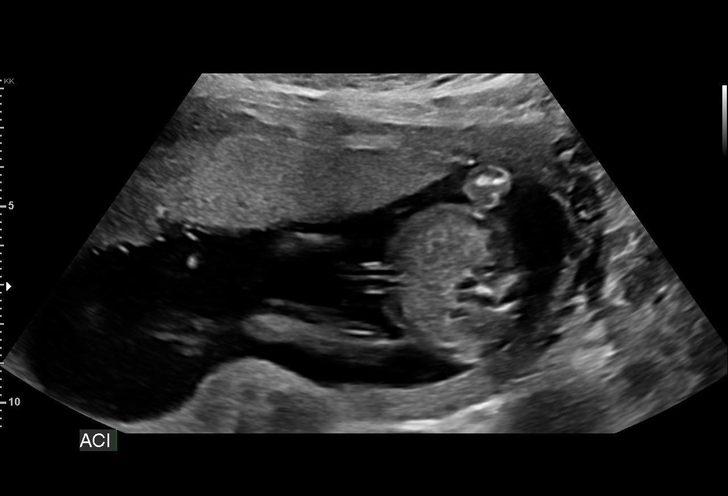
[im 47/91]
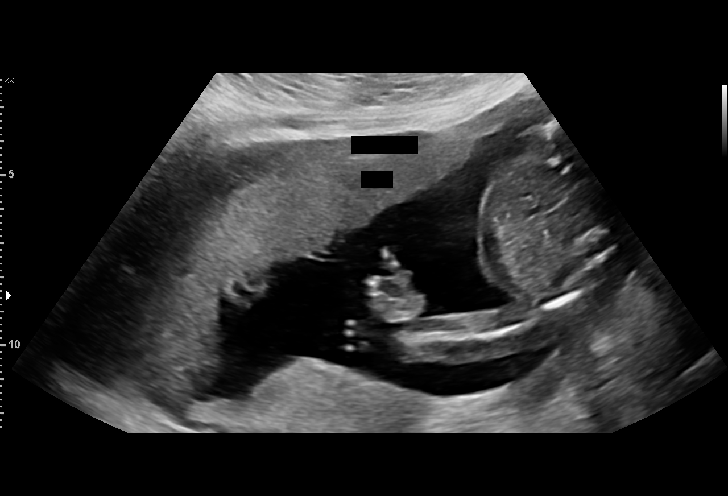
[im 54/91]
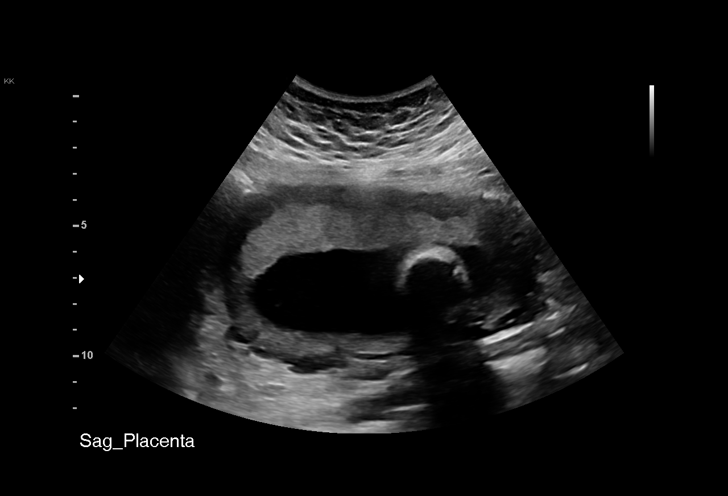
[im 61/91]
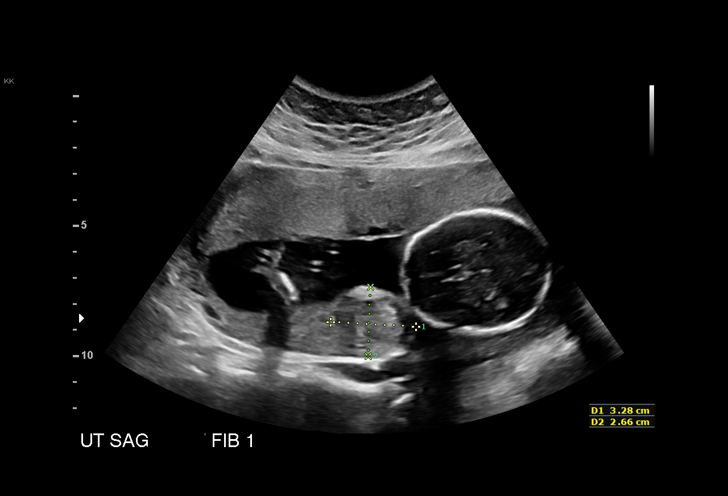
[im 67/91]
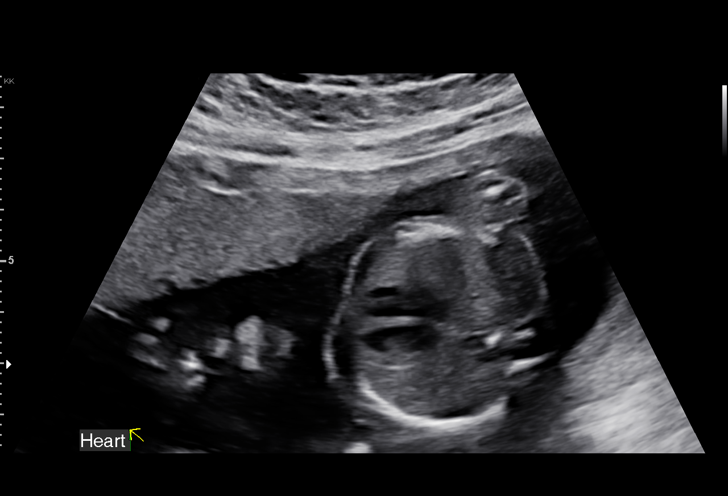
[im 74/91]
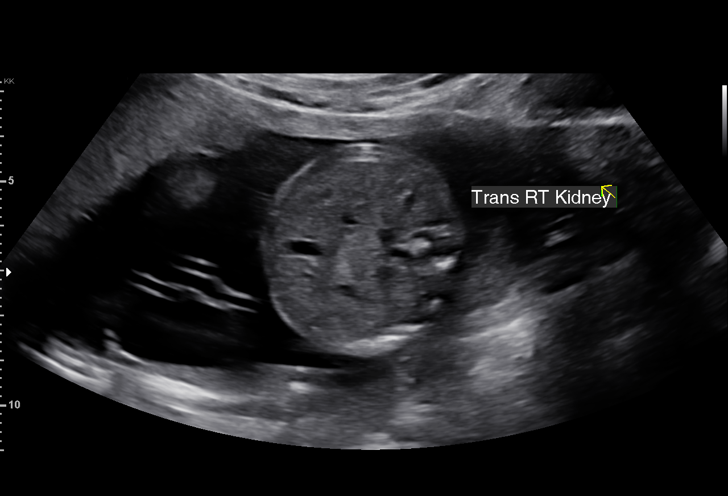
[im 81/91]
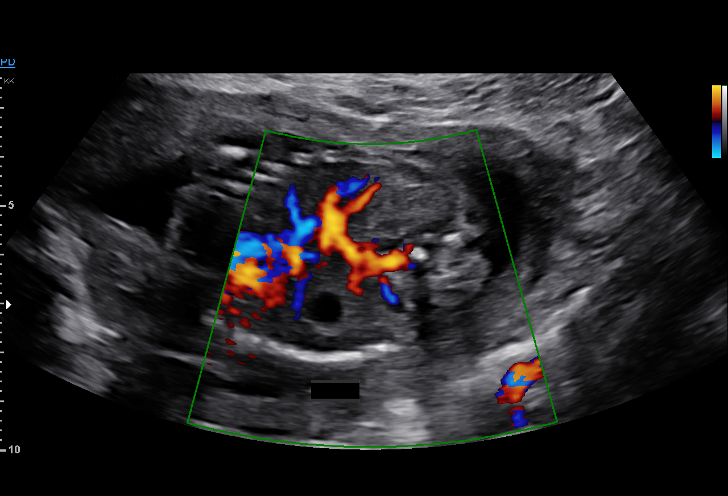
[im 87/91]
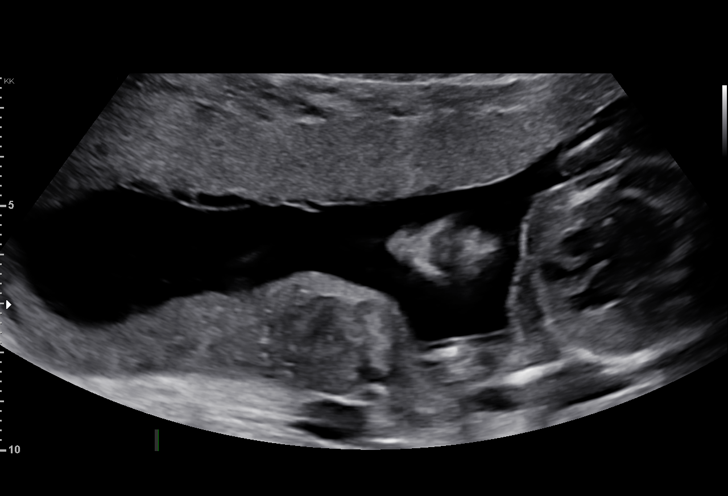

[13 of 28 positions shown; findings below may reference images not displayed]

----------------------------------------------------------------------

 ----------------------------------------------------------------------
Indications

  Encounter for antenatal screening for
  malformations
  Advanced maternal age multigravida 35+,
  second trimester (37 yr.,Low Risk NIPS)
  Obesity complicating pregnancy, second
  trimester(BMI 36.8)
  19 weeks gestation of pregnancy
  Uterine fibroids
 ----------------------------------------------------------------------
Vital Signs

 BMI:
Fetal Evaluation

 Num Of Fetuses:         1
 Fetal Heart Rate(bpm):  147
 Cardiac Activity:       Observed
 Presentation:           Cephalic
 Placenta:               Anterior
 P. Cord Insertion:      Visualized

 Amniotic Fluid
 AFI FV:      Within normal limits

                             Largest Pocket(cm)

Biometry

 BPD:      46.9  mm     G. Age:  20w 1d         91  %    CI:        75.16   %    70 - 86
                                                         FL/HC:      17.8   %    16.1 -
 HC:      171.6  mm     G. Age:  19w 5d         77  %    HC/AC:      1.13        1.09 -
 AC:      151.3  mm     G. Age:  20w 2d         85  %    FL/BPD:     65.2   %
 FL:       30.6  mm     G. Age:  19w 3d         60  %    FL/AC:      20.2   %    20 - 24
 HUM:        32  mm     G. Age:  20w 5d         95  %
 NFT:       3.3  mm

 Est. FW:     322  gm    0 lb 11 oz      59  %
OB History

 Gravidity:    3         Term:   2        Prem:   2        SAB:   0
 TOP:          0       Ectopic:  0        Living: 2
Gestational Age

 LMP:           19w 0d        Date:  12/25/17                 EDD:   10/01/18
 U/S Today:     19w 6d                                        EDD:   09/25/18
 Best:          19w 0d     Det. By:  LMP  (12/25/17)          EDD:   10/01/18
Anatomy

 Cranium:               Appears normal         Aortic Arch:            Not well visualized
 Cavum:                 Appears normal         Ductal Arch:            Not well visualized
 Ventricles:            Appears normal         Diaphragm:              Not well visualized
 Choroid Plexus:        Appears normal         Stomach:                Appears normal, left
                                                                       sided
 Cerebellum:            Appears normal         Abdomen:                Appears normal
 Posterior Fossa:       Appears normal         Abdominal Wall:         Appears nml (cord
                                                                       insert, abd wall)
 Nuchal Fold:           Appears normal         Cord Vessels:           Appears normal (3
                                                                       vessel cord)
 Face:                  Orbits appear          Kidneys:                Appear normal
                        normal
 Lips:                  Not well visualized    Bladder:                Appears normal
 Thoracic:              Appears normal         Spine:                  Not well visualized
 Heart:                 Not well visualized    Upper Extremities:      Appears normal
 RVOT:                  Not well visualized    Lower Extremities:      Appears normal
 LVOT:                  Not well visualized

 Other:  Male gender. Lt. Heel and 5th digit visualized. Technically difficult due
         to maternal habitus and fetal position.
Cervix Uterus Adnexa

 Cervix
 Length:            3.8  cm.
 Normal appearance by transabdominal scan.
Myomas

  Site                     L(cm)      W(cm)      D(cm)      Location
  Posterior
 ----------------------------------------------------------------------

  Blood Flow                 RI        PI       Comments

 ----------------------------------------------------------------------
Impression

 We performed fetal anatomy scan. No makers of
 aneuploidies or fetal structural defects are seen. Fetal
 biometry is consistent with her previously-established dates.
 Amniotic fluid is normal and good fetal activity is seen.
 A small posterior intramural myoma is seen.

 On cell-free fetal DNA screening, the risks of fetal
 aneuploidies are not increased.

 Maternal obesity imposes limitations on the resolution of
 images, and failure to detect fetal anomalies is more common
 in obese pregnant women.
Recommendations

 -An appointment was made for her to return in 4 weeks for
 completion of fetal anatomy.
 -Fetal growth assessment at 32 weeks.
                 Hong, Majkell

## 2023-12-06 ENCOUNTER — Ambulatory Visit: Payer: Self-pay

## 2023-12-06 NOTE — Telephone Encounter (Signed)
 FYI Only or Action Required?: FYI only for provider.  Patient was last seen in primary care on N/A  Called Nurse Triage reporting Fatigue.  Symptoms began several months ago.  Interventions attempted: Nothing.  Symptoms are: gradually worsening.  Triage Disposition: See PCP When Office is Open (Within 3 Days)  Patient/caregiver understands and will follow disposition?: Yes   Pt scheduled for NPA but advised to go to ER for left shoulder pain. Pt stated understanding  Copied from CRM [id number]. Topic: Clinical - Red Word Triage >> Dec 06, 2023  3:09 PM Alfonso ORN wrote: Red Word that prompted transfer to Nurse Triage: son Pecola ludwig calling on behalf of pt and patient is on phone with Arabic interpreter (interpreter name dalia id [phone] ) patient body feel down ,anything she does feel exhausted    pt has no pcp and would like to establish care with [facility]    ----------------------------------------------------------------------- From previous Reason for Contact - Scheduling: Patient/patient representative is calling to schedule an appointment. Refer to attachments for appointment information. Reason for Disposition  [1] Fatigue (i.e., tires easily, decreased energy) AND [2] persists > 1 week  Answer Assessment - Initial Assessment Questions Pt has a history of anemia, stopped taking supplements about 6 motnhs ago. About 2 months ago symptoms returned, fatigue, hair loss, anxiety, difficulty sleeping.     1. DESCRIPTION: Describe how you are feeling.     Fatigued with minimal exertion 2. SEVERITY: How bad is it?  Can you stand and walk?     Yes can walk 3. ONSET: When did these symptoms begin? (e.g., hours, days, weeks, months)     A couple of months, headache the last 3 days 4. CAUSE: What do you think is causing the weakness or fatigue? (e.g., not drinking enough fluids, medical problem, trouble sleeping) Has a history of anemia  6. OTHER SYMPTOMS:  Do you have any other symptoms? (e.g., chest pain, fever, cough, SOB, vomiting, diarrhea, bleeding, other areas of pain)     Sometimes left shoulder pain in the neck when she lifts or raises arm Strong  Protocols used: Weakness (Generalized) and Fatigue-A-AH

## 2024-02-26 NOTE — Progress Notes (Signed)
 "   New Patient Office Visit   Subjective     Patient ID: Vickie Baldwin, female   DOB: January 29, 1981  Age: 43 y.o. MRN: [id number]   CC:  Chief Complaint  Patient presents with   Establish Care   Fatigue   Anemia    Patient stated that in the past patient was anemic      HPI Vickie Baldwin presents to establish care. She has not had recent primary care. She is living with her husband and 3 children. She does not work outside the home.   She has an interpreter with her today.    Discussed the use of AI scribe software for clinical note transcription with the patient, who gave verbal consent to proceed.  History of Present Illness Vickie Baldwin is a 43 year old female who presents with anemia.  She has experienced anemia for the past four years, beginning after her last delivery in 2020. During this time, she has felt very tired and cold. She was previously prescribed a vitamin for anemia (Fusion Plus), which she stopped taking over two months ago. While on the medication, she felt better and was able to return to her normal life, but symptoms returned after discontinuation.  Her menstrual cycles are regular, occurring once a month and lasting seven to eight days, with the first three days being normal in flow. No recent prolonged or heavy bleeding.  She is currently taking prenatal vitamins. No allergies to medications.  Her family history includes diabetes in her mother, two sisters, and father, and hypertension in both parents. She denies any use of alcohol, drugs, nicotine, or tobacco. She is not currently working outside the home and lives alone.      Show/hide medication list[1] Past Medical History:  Diagnosis Date   Supervision of high-risk pregnancy 02/08/2018   BabyRx 3/30--BP cuff given in office Nursing Staff Provider Office Location  [facility] Dating  LMP Language   ARABIC Anatomy US    incomplete Flu Vaccine  03/11/18 Genetic Screen  NIPS: low risks     AFP:  neg TDaP  vaccine   06/03/2018 Hgb A1C or  GTT Early  Third trimester  Rhogam     LAB RESULTS  Feeding Plan  breastfeed/bottle Blood Type O/Positive/-- (01/06 1129)  Contraception  none Antibody Neg    Past Surgical History:  Procedure Laterality Date   CESAREAN SECTION     CESAREAN SECTION N/A 07/31/2018   Procedure: CESAREAN SECTION;  Surgeon: Fredirick Glenys RAMAN, MD;  Location: [facility] LD ORS;  Service: Obstetrics;  Laterality: N/A;   TONSILLECTOMY       Family History  Problem Relation Age of Onset   Diabetes Mother    Hypertension Mother    Hypertension Father    Diabetes Sister    Diabetes Sister    Diabetes Brother     Social History   Socioeconomic History   Marital status: Married    Spouse name: Not on file   Number of children: Not on file   Years of education: Not on file   Highest education level: GED or equivalent  Occupational History   Not on file  Tobacco Use   Smoking status: Never   Smokeless tobacco: Never  Vaping Use   Vaping status: Never Used  Substance and Sexual Activity   Alcohol use: Never   Drug use: Never   Sexual activity: Yes    Partners: Male    Birth control/protection: I.U.D.  Other Topics Concern  Not on file  Social History Narrative   Not on file   Social Drivers of Health   Tobacco Use: Low Risk (02/27/2024)   Patient History    Smoking Tobacco Use: Never    Smokeless Tobacco Use: Never    Passive Exposure: Not on file  Financial Resource Strain: Low Risk (02/20/2024)   Overall Financial Resource Strain (CARDIA)    Difficulty of Paying Living Expenses: Not very hard  Food Insecurity: No Food Insecurity (02/20/2024)   Epic    Worried About Programme Researcher, Broadcasting/film/video in the Last Year: Never true    Ran Out of Food in the Last Year: Never true  Transportation Needs: No Transportation Needs (02/20/2024)   Epic    Lack of Transportation (Medical): No    Lack of Transportation (Non-Medical): No  Physical Activity: Not on file  Stress: Not on file   Social Connections: Moderately Integrated (02/20/2024)   Social Connection and Isolation Panel    Frequency of Communication with Friends and Family: More than three times a week    Frequency of Social Gatherings with Friends and Family: Once a week    Attends Religious Services: 1 to 4 times per year    Active Member of [employer] or Organizations: No    Attends Banker Meetings: Not on file    Marital Status: Married  Depression (PHQ2-9): Low Risk (02/27/2024)   Depression (PHQ2-9)    PHQ-2 Score: 1  Alcohol Screen: Not on file  Housing: Low Risk (02/20/2024)   Epic    Unable to Pay for Housing in the Last Year: No    Number of Times Moved in the Last Year: 0    Homeless in the Last Year: No  Utilities: Not on file  Health Literacy: Not on file       ROS All review of systems negative except what is listed in the HPI    Objective     BP 130/75 (BP Location: Left Arm, Patient Position: Sitting, Cuff Size: Large)   Pulse 80   Temp 98.1 F (36.7 C) (Oral)   Ht 5' 2 (1.575 m)   Wt 203 lb 9.6 oz (92.4 kg)   LMP 02/07/2024 (Approximate)   SpO2 98%   BMI 37.24 kg/m   Physical Exam Vitals reviewed.  Constitutional:      Appearance: Normal appearance. She is obese.  Cardiovascular:     Rate and Rhythm: Normal rate and regular rhythm.     Heart sounds: Normal heart sounds.  Pulmonary:     Effort: Pulmonary effort is normal.     Breath sounds: Normal breath sounds.  Skin:    General: Skin is warm and dry.  Neurological:     Mental Status: She is alert and oriented to person, place, and time.  Psychiatric:        Mood and Affect: Mood normal.        Behavior: Behavior normal.        Thought Content: Thought content normal.        Judgment: Judgment normal.        Assessment & Plan:     Problem List Items Addressed This Visit   None Visit Diagnoses       Fatigue, unspecified type    -  Primary Anemia with persistent fatigue and cold intolerance.  Previous vitamin supplement improved symptoms but was discontinued. - Order blood work to assess anemia, iron  levels, B vitamins, and thyroid function.   Relevant  Orders   CBC with Differential/Platelet   Comprehensive metabolic panel with GFR   B12 and Folate Panel   TSH   Iron , TIBC and Ferritin Panel     Encounter for medical examination to establish care          Anemia, unspecified type    \ See above   Relevant Orders   CBC with Differential/Platelet   Comprehensive metabolic panel with GFR   B12 and Folate Panel   TSH   Iron , TIBC and Ferritin Panel     Screening, lipid       Relevant Orders   Lipid panel     Screening for diabetes mellitus       Relevant Orders   Hemoglobin A1c     Class 2 obesity without serious comorbidity with body mass index (BMI) of 37.0 to 37.9 in adult, unspecified obesity type       Relevant Orders   CBC with Differential/Platelet   Comprehensive metabolic panel with GFR   TSH   Hemoglobin A1c   Lipid panel                 Return for - pending results or sooner if needed.  Waddell KATHEE Mon, NP  I,Emily Lagle,acting as a scribe for Waddell KATHEE Mon, NP.,have documented all relevant documentation on the behalf of Waddell KATHEE Mon, NP.  I, Waddell KATHEE Mon, NP, have reviewed all documentation for this visit. The documentation on 02/27/2024 for the exam, diagnosis, procedures, and orders are all accurate and complete.     [1]  Outpatient Medications Prior to Visit  Medication Sig   Prenat-FeCbn-FeAsp-Meth-FA-DHA (PRENATE MINI ) 18-0.6-0.4-350 MG CAPS Take 1 tablet by mouth daily.   [DISCONTINUED] docusate sodium  (COLACE) 100 MG capsule Take 1 capsule (100 mg total) by mouth 2 (two) times daily as needed.   [DISCONTINUED] ibuprofen  (ADVIL ) 800 MG tablet Take 1 tablet (800 mg total) by mouth every 6 (six) hours.   [DISCONTINUED] Misc. Devices MISC Dispense one maternity belt for patient   [DISCONTINUED] oxyCODONE -acetaminophen  (PERCOCET/ROXICET)  5-325 MG tablet Take 1-2 tablets by mouth every 4 (four) hours as needed for moderate pain.   No facility-administered medications prior to visit.   "

## 2024-02-27 ENCOUNTER — Encounter: Payer: Self-pay | Admitting: Family Medicine

## 2024-02-27 ENCOUNTER — Ambulatory Visit (INDEPENDENT_AMBULATORY_CARE_PROVIDER_SITE_OTHER): Admitting: Family Medicine

## 2024-02-27 VITALS — BP 130/75 | HR 80 | Temp 98.1°F | Ht 62.0 in | Wt 203.6 lb

## 2024-02-27 DIAGNOSIS — Z0000 Encounter for general adult medical examination without abnormal findings: Secondary | ICD-10-CM | POA: Diagnosis not present

## 2024-02-27 DIAGNOSIS — Z6837 Body mass index (BMI) 37.0-37.9, adult: Secondary | ICD-10-CM | POA: Diagnosis not present

## 2024-02-27 DIAGNOSIS — Z13220 Encounter for screening for lipoid disorders: Secondary | ICD-10-CM

## 2024-02-27 DIAGNOSIS — E66812 Obesity, class 2: Secondary | ICD-10-CM

## 2024-02-27 DIAGNOSIS — Z131 Encounter for screening for diabetes mellitus: Secondary | ICD-10-CM

## 2024-02-27 DIAGNOSIS — D649 Anemia, unspecified: Secondary | ICD-10-CM | POA: Diagnosis not present

## 2024-02-27 DIAGNOSIS — R5383 Other fatigue: Secondary | ICD-10-CM

## 2024-02-27 NOTE — Patient Instructions (Signed)

## 2024-02-28 LAB — HEMOGLOBIN A1C
Hgb A1c MFr Bld: 5.3 %
Mean Plasma Glucose: 105 mg/dL
eAG (mmol/L): 5.8 mmol/L

## 2024-02-28 LAB — IRON,TIBC AND FERRITIN PANEL
%SAT: 24 % (ref 16–45)
Ferritin: 14 ng/mL — ABNORMAL LOW (ref 16–232)
Iron: 70 ug/dL (ref 40–190)
TIBC: 289 ug/dL (ref 250–450)

## 2024-02-28 LAB — CBC WITH DIFFERENTIAL/PLATELET
Absolute Lymphocytes: 1465 {cells}/uL (ref 850–3900)
Absolute Monocytes: 393 {cells}/uL (ref 200–950)
Basophils Absolute: 51 {cells}/uL (ref 0–200)
Basophils Relative: 0.9 %
Eosinophils Absolute: 291 {cells}/uL (ref 15–500)
Eosinophils Relative: 5.1 %
HCT: 42 % (ref 35.9–46.0)
Hemoglobin: 13.7 g/dL (ref 11.7–15.5)
MCH: 28.9 pg (ref 27.0–33.0)
MCHC: 32.6 g/dL (ref 31.6–35.4)
MCV: 88.6 fL (ref 81.4–101.7)
MPV: 12.2 fL (ref 7.5–12.5)
Monocytes Relative: 6.9 %
Neutro Abs: 3500 {cells}/uL (ref 1500–7800)
Neutrophils Relative %: 61.4 %
Platelets: 227 Thousand/uL (ref 140–400)
RBC: 4.74 Million/uL (ref 3.80–5.10)
RDW: 13.6 % (ref 11.0–15.0)
Total Lymphocyte: 25.7 %
WBC: 5.7 Thousand/uL (ref 3.8–10.8)

## 2024-02-28 LAB — LIPID PANEL
Cholesterol: 165 mg/dL
HDL: 52 mg/dL
LDL Cholesterol (Calc): 98 mg/dL
Non-HDL Cholesterol (Calc): 113 mg/dL
Total CHOL/HDL Ratio: 3.2 (calc)
Triglycerides: 65 mg/dL

## 2024-02-28 LAB — B12 AND FOLATE PANEL
Folate: >24 ng/mL
Vitamin B-12: 253 pg/mL (ref 200–1100)

## 2024-02-28 LAB — COMPREHENSIVE METABOLIC PANEL WITH GFR
AG Ratio: 1.5 (calc) (ref 1.0–2.5)
ALT: 10 U/L (ref 6–29)
AST: 12 U/L (ref 10–30)
Albumin: 4 g/dL (ref 3.6–5.1)
Alkaline phosphatase (APISO): 72 U/L (ref 31–125)
BUN: 11 mg/dL (ref 7–25)
CO2: 27 mmol/L (ref 20–32)
Calcium: 9.3 mg/dL (ref 8.6–10.2)
Chloride: 104 mmol/L (ref 98–110)
Creat: 0.6 mg/dL (ref 0.50–0.99)
Globulin: 2.6 g/dL (ref 1.9–3.7)
Glucose, Bld: 98 mg/dL (ref 65–99)
Potassium: 4.2 mmol/L (ref 3.5–5.3)
Sodium: 139 mmol/L (ref 135–146)
Total Bilirubin: 0.3 mg/dL (ref 0.2–1.2)
Total Protein: 6.6 g/dL (ref 6.1–8.1)
eGFR: 113 mL/min/1.73m2

## 2024-02-28 LAB — TSH: TSH: 2.29 m[IU]/L

## 2024-03-02 ENCOUNTER — Encounter: Payer: Self-pay | Admitting: Family Medicine

## 2024-03-03 ENCOUNTER — Ambulatory Visit: Payer: Self-pay | Admitting: Family Medicine
